# Patient Record
Sex: Female | Born: 1955
Health system: Southern US, Community
[De-identification: ages and names within clinical notes are randomized; demographics above are authoritative.]

## PROBLEM LIST (undated history)

## (undated) ENCOUNTER — Ambulatory Visit (HOSPITAL_COMMUNITY): Admission: EM | Payer: 59 | Source: Home / Self Care

## (undated) DIAGNOSIS — G709 Myoneural disorder, unspecified: Secondary | ICD-10-CM

## (undated) DIAGNOSIS — I499 Cardiac arrhythmia, unspecified: Secondary | ICD-10-CM

## (undated) DIAGNOSIS — I10 Essential (primary) hypertension: Secondary | ICD-10-CM

## (undated) DIAGNOSIS — E042 Nontoxic multinodular goiter: Secondary | ICD-10-CM

## (undated) DIAGNOSIS — E785 Hyperlipidemia, unspecified: Secondary | ICD-10-CM

## (undated) HISTORY — PX: SHOULDER ARTHROSCOPY: SHX128

## (undated) HISTORY — PX: APPENDECTOMY: SHX54

## (undated) HISTORY — PX: KNEE ARTHROSCOPY: SUR90

---

## 2005-04-24 ENCOUNTER — Ambulatory Visit: Payer: Self-pay | Admitting: Pain Medicine

## 2017-03-11 MED FILL — PROPRANOLOL HCL ER 60 MG CP: 60 | 90 days supply | Qty: 90 | Fill #0

## 2017-03-11 MED FILL — MOMETASONE FUROATE 0.1% CRM: 0.1 | 30 days supply | Qty: 45 | Fill #0

## 2017-05-19 DIAGNOSIS — M20011 Mallet finger of right finger(s): Secondary | ICD-10-CM | POA: Diagnosis not present

## 2017-05-29 DIAGNOSIS — R002 Palpitations: Secondary | ICD-10-CM | POA: Diagnosis not present

## 2017-06-05 MED FILL — FENOFIBRATE 145 MG TAB: 145 | 30 days supply | Qty: 30 | Fill #0

## 2017-06-08 MED FILL — PROPRANOLOL HCL ER 60 MG CP: 60 | 90 days supply | Qty: 90 | Fill #1

## 2017-06-16 DIAGNOSIS — M20011 Mallet finger of right finger(s): Secondary | ICD-10-CM | POA: Diagnosis not present

## 2017-06-22 MED FILL — ZOLPIDEM TARTRATE 5 MG TAB: 5 | 30 days supply | Qty: 30 | Fill #0

## 2017-07-06 MED FILL — FENOFIBRATE 145 MG TAB: 145 | 30 days supply | Qty: 30 | Fill #1

## 2017-07-21 DIAGNOSIS — M20011 Mallet finger of right finger(s): Secondary | ICD-10-CM | POA: Diagnosis not present

## 2017-08-18 DIAGNOSIS — M20011 Mallet finger of right finger(s): Secondary | ICD-10-CM | POA: Diagnosis not present

## 2017-08-21 MED FILL — FENOFIBRATE 145 MG TABLET: 145 | 30 days supply | Qty: 30 | Fill #2

## 2017-08-24 MED FILL — MOMETASONE FUROATE 0.1% CRM: 0.1 | 20 days supply | Qty: 45 | Fill #0

## 2017-08-26 MED FILL — PROPRANOLOL HCL ER 60 MG CP: 60 | 90 days supply | Qty: 90 | Fill #2

## 2017-08-28 MED FILL — EZETIMIBE 10 MG TAB: 10 | 90 days supply | Qty: 90 | Fill #0

## 2017-09-22 DIAGNOSIS — M20011 Mallet finger of right finger(s): Secondary | ICD-10-CM | POA: Diagnosis not present

## 2017-10-05 MED FILL — FENOFIBRATE 145 MG TABLET: 145 | 30 days supply | Qty: 30 | Fill #3

## 2017-10-20 DIAGNOSIS — M20011 Mallet finger of right finger(s): Secondary | ICD-10-CM | POA: Diagnosis not present

## 2017-11-16 MED FILL — FENOFIBRATE 145 MG TABLET: 145 | 30 days supply | Qty: 30 | Fill #4

## 2017-12-04 MED FILL — EZETIMIBE 10 MG TABS: 10 | 90 days supply | Qty: 90 | Fill #1

## 2017-12-10 MED FILL — FENOFIBRATE 145 MG TABLET: 145 | 90 days supply | Qty: 90 | Fill #5

## 2018-01-04 DIAGNOSIS — E782 Mixed hyperlipidemia: Secondary | ICD-10-CM | POA: Diagnosis not present

## 2018-01-04 DIAGNOSIS — I10 Essential (primary) hypertension: Secondary | ICD-10-CM | POA: Diagnosis not present

## 2018-01-04 MED FILL — PROPRANOLOL HCL ER 60 MG CP: 60 | 90 days supply | Qty: 90 | Fill #0

## 2018-02-26 MED FILL — EZETIMIBE 10 MG TABLET: 10 | 90 days supply | Qty: 90 | Fill #2

## 2018-03-29 MED FILL — FENOFIBRATE 145 MG TAB: 145 | 90 days supply | Qty: 90 | Fill #6

## 2018-03-29 MED FILL — PROPRANOLOL HCL ER 60 MG CP: 60 | 90 days supply | Qty: 90 | Fill #1

## 2018-06-25 MED FILL — EZETIMIBE 10 MG TABLET: 10 | 90 days supply | Qty: 90 | Fill #3

## 2018-06-25 MED FILL — PROPRANOLOL HCL ER 60 MG CP: 60 | 90 days supply | Qty: 90 | Fill #0

## 2018-06-25 MED FILL — FENOFIBRATE 145 MG TAB: 145 | 90 days supply | Qty: 90 | Fill #0

## 2018-06-29 DIAGNOSIS — Z1211 Encounter for screening for malignant neoplasm of colon: Secondary | ICD-10-CM | POA: Diagnosis not present

## 2018-06-29 DIAGNOSIS — I10 Essential (primary) hypertension: Secondary | ICD-10-CM | POA: Diagnosis not present

## 2018-06-29 DIAGNOSIS — Z0000 Encounter for general adult medical examination without abnormal findings: Secondary | ICD-10-CM | POA: Diagnosis not present

## 2018-06-29 DIAGNOSIS — E7800 Pure hypercholesterolemia, unspecified: Secondary | ICD-10-CM | POA: Diagnosis not present

## 2018-07-15 ENCOUNTER — Encounter: Payer: Self-pay | Admitting: *Deleted

## 2018-07-19 ENCOUNTER — Telehealth: Payer: Self-pay | Admitting: Gastroenterology

## 2018-07-19 ENCOUNTER — Other Ambulatory Visit: Payer: Self-pay

## 2018-07-19 DIAGNOSIS — Z1211 Encounter for screening for malignant neoplasm of colon: Secondary | ICD-10-CM

## 2018-07-19 NOTE — Telephone Encounter (Signed)
ERROR

## 2018-07-19 NOTE — Telephone Encounter (Signed)
Gastroenterology Pre-Procedure Review  Request Date: 08/11/18   [facility] Requesting Physician: Dr. Marius Ditch  PATIENT REVIEW QUESTIONS: The patient responded to the following health history questions as indicated:    1. Are you having any GI issues? no 2. Do you have a personal history of Polyps? yes (2010??  polyps) 3. Do you have a family history of Colon Cancer or Polyps? no 4. Diabetes Mellitus? no 5. Joint replacements in the past 12 months?no 6. Major health problems in the past 3 months?no 7. Any artificial heart valves, MVP, or defibrillator?no    MEDICATIONS & ALLERGIES:    Patient reports the following regarding taking any anticoagulation/antiplatelet therapy:   Plavix, Coumadin, Eliquis, Xarelto, Lovenox, Pradaxa, Brilinta, or Effient? no Aspirin? no  Patient confirms/reports the following medications:  No current outpatient medications on file.   No current facility-administered medications for this visit.   Hydrocodone Percocet Theraflu  Patient confirms/reports the following allergies:  Allergies not on file  No orders of the defined types were placed in this encounter.   AUTHORIZATION INFORMATION Primary Insurance: 1D#: Group #:  Secondary Insurance: 1D#: Group #:  SCHEDULE INFORMATION: Date: 08/11/2018    [place] Time: Location:  [place]

## 2018-07-28 DIAGNOSIS — I10 Essential (primary) hypertension: Secondary | ICD-10-CM | POA: Diagnosis not present

## 2018-08-05 NOTE — Discharge Instructions (Signed)
General Anesthesia, Adult, Care After °These instructions provide you with information about caring for yourself after your procedure. Your health care provider may also give you more specific instructions. Your treatment has been planned according to current medical practices, but problems sometimes occur. Call your health care provider if you have any problems or questions after your procedure. °What can I expect after the procedure? °After the procedure, it is common to have: °· Vomiting. °· A sore throat. °· Mental slowness. ° °It is common to feel: °· Nauseous. °· Cold or shivery. °· Sleepy. °· Tired. °· Sore or achy, even in parts of your body where you did not have surgery. ° °Follow these instructions at home: °For at least 24 hours after the procedure: °· Do not: °? Participate in activities where you could fall or become injured. °? Drive. °? Use heavy machinery. °? Drink alcohol. °? Take sleeping pills or medicines that cause drowsiness. °? Make important decisions or sign legal documents. °? Take care of children on your own. °· Rest. °Eating and drinking °· If you vomit, drink water, juice, or soup when you can drink without vomiting. °· Drink enough fluid to keep your urine clear or pale yellow. °· Make sure you have little or no nausea before eating solid foods. °· Follow the diet recommended by your health care provider. °General instructions °· Have a responsible adult stay with you until you are awake and alert. °· Return to your normal activities as told by your health care provider. Ask your health care provider what activities are safe for you. °· Take over-the-counter and prescription medicines only as told by your health care provider. °· If you smoke, do not smoke without supervision. °· Keep all follow-up visits as told by your health care provider. This is important. °Contact a health care provider if: °· You continue to have nausea or vomiting at home, and medicines are not helpful. °· You  cannot drink fluids or start eating again. °· You cannot urinate after 8-12 hours. °· You develop a skin rash. °· You have fever. °· You have increasing redness at the site of your procedure. °Get help right away if: °· You have difficulty breathing. °· You have chest pain. °· You have unexpected bleeding. °· You feel that you are having a life-threatening or urgent problem. °This information is not intended to replace advice given to you by your health care provider. Make sure you discuss any questions you have with your health care provider. °Document Released: 02/23/2001 Document Revised: 04/21/2016 Document Reviewed: 11/01/2015 °Elsevier Interactive Patient Education © 2018 [employer]vier Inc. ° °

## 2018-08-06 ENCOUNTER — Other Ambulatory Visit: Payer: Self-pay

## 2018-08-09 MED FILL — SUPREP BOWEL PREP KIT: 17.5-3.13-1 | 1 days supply | Qty: 354 | Fill #0

## 2018-08-11 ENCOUNTER — Ambulatory Visit
Admission: RE | Admit: 2018-08-11 | Discharge: 2018-08-11 | Disposition: A | Discharge status: Home / Self Care | Payer: 59 | Source: Ambulatory Visit | Attending: Gastroenterology | Admitting: Gastroenterology

## 2018-08-11 ENCOUNTER — Encounter
Admission: RE | Disposition: A | Discharge status: Home / Self Care | Payer: Self-pay | Source: Ambulatory Visit | Attending: Gastroenterology

## 2018-08-11 ENCOUNTER — Ambulatory Visit: Payer: 59 | Admitting: Anesthesiology

## 2018-08-11 DIAGNOSIS — I10 Essential (primary) hypertension: Secondary | ICD-10-CM | POA: Insufficient documentation

## 2018-08-11 DIAGNOSIS — Z1211 Encounter for screening for malignant neoplasm of colon: Secondary | ICD-10-CM | POA: Insufficient documentation

## 2018-08-11 DIAGNOSIS — D120 Benign neoplasm of cecum: Secondary | ICD-10-CM

## 2018-08-11 DIAGNOSIS — E785 Hyperlipidemia, unspecified: Secondary | ICD-10-CM | POA: Insufficient documentation

## 2018-08-11 DIAGNOSIS — K635 Polyp of colon: Secondary | ICD-10-CM | POA: Diagnosis not present

## 2018-08-11 DIAGNOSIS — Z79899 Other long term (current) drug therapy: Secondary | ICD-10-CM | POA: Insufficient documentation

## 2018-08-11 HISTORY — DX: Cardiac arrhythmia, unspecified: I49.9

## 2018-08-11 HISTORY — DX: Hyperlipidemia, unspecified: E78.5

## 2018-08-11 HISTORY — DX: Nontoxic multinodular goiter: E04.2

## 2018-08-11 HISTORY — DX: Myoneural disorder, unspecified: G70.9

## 2018-08-11 HISTORY — PX: COLONOSCOPY WITH PROPOFOL: SHX5780

## 2018-08-11 HISTORY — PX: POLYPECTOMY: SHX5525

## 2018-08-11 HISTORY — DX: Essential (primary) hypertension: I10

## 2018-08-11 SURGERY — COLONOSCOPY WITH PROPOFOL
Anesthesia: General | Site: Rectum

## 2018-08-11 MED ORDER — ONDANSETRON HCL 4 MG/2ML IJ SOLN: 4.0000 mg | Freq: Once | INTRAMUSCULAR | Status: DC | PRN

## 2018-08-11 MED ORDER — SODIUM CHLORIDE 0.9 % IV SOLN: INTRAVENOUS | Status: DC

## 2018-08-11 MED ORDER — PROPOFOL 10 MG/ML IV BOLUS
INTRAVENOUS | Status: DC | PRN
  Administered 2018-08-11: 60 mg via INTRAVENOUS
  Administered 2018-08-11 (×4): 20 mg via INTRAVENOUS

## 2018-08-11 MED ORDER — LACTATED RINGERS IV SOLN
10.0000 mL/h | INTRAVENOUS | Status: DC
  Administered 2018-08-11 (×2): via INTRAVENOUS
  Administered 2018-08-11: 10 mL/h via INTRAVENOUS

## 2018-08-11 MED ORDER — LIDOCAINE HCL (CARDIAC) PF 100 MG/5ML IV SOSY
PREFILLED_SYRINGE | INTRAVENOUS | Status: DC | PRN
  Administered 2018-08-11: 100 mg via INTRAVENOUS

## 2018-08-11 SURGICAL SUPPLY — 25 items
CANISTER SUCT 1200ML W/VALVE (MISCELLANEOUS) ×3 IMPLANT
CLIP HMST 235XBRD CATH ROT (MISCELLANEOUS) IMPLANT
CLIP RESOLUTION 360 11X235 (MISCELLANEOUS)
ELECT REM PT RETURN 9FT ADLT (ELECTROSURGICAL)
ELECTRODE REM PT RTRN 9FT ADLT (ELECTROSURGICAL) IMPLANT
FCP ESCP3.2XJMB 240X2.8X (MISCELLANEOUS)
FORCEPS BIOP RAD 4 LRG CAP 4 (CUTTING FORCEPS) ×3 IMPLANT
FORCEPS BIOP RJ4 240 W/NDL (MISCELLANEOUS)
FORCEPS ESCP3.2XJMB 240X2.8X (MISCELLANEOUS) IMPLANT
GOWN CVR UNV OPN BCK APRN NK (MISCELLANEOUS) ×2 IMPLANT
GOWN ISOL THUMB LOOP REG UNIV (MISCELLANEOUS) ×4
INJECTOR VARIJECT VIN23 (MISCELLANEOUS) IMPLANT
KIT DEFENDO VALVE AND CONN (KITS) IMPLANT
KIT ENDO PROCEDURE OLY (KITS) ×3 IMPLANT
MARKER SPOT ENDO TATTOO 5ML (MISCELLANEOUS) IMPLANT
PROBE APC STR FIRE (PROBE) IMPLANT
RETRIEVER NET ROTH 2.5X230 LF (MISCELLANEOUS) IMPLANT
SNARE COLD EXACTO (MISCELLANEOUS) IMPLANT
SNARE SHORT THROW 13M SML OVAL (MISCELLANEOUS) IMPLANT
SNARE SHORT THROW 30M LRG OVAL (MISCELLANEOUS) IMPLANT
SNARE SNG USE RND 15MM (INSTRUMENTS) IMPLANT
SPOT EX ENDOSCOPIC TATTOO (MISCELLANEOUS)
TRAP ETRAP POLY (MISCELLANEOUS) IMPLANT
VARIJECT INJECTOR VIN23 (MISCELLANEOUS)
WATER STERILE IRR 250ML POUR (IV SOLUTION) ×3 IMPLANT

## 2018-08-11 NOTE — Op Note (Signed)
[facility] Gastroenterology Patient Name: Nicole Marsh Procedure Date: 08/11/2018 7:50 AM MRN: [id number] Account #: [id number] Date of Birth: 1956/02/28 Admit Type: Outpatient Age: 62 Room: [facility] OR ROOM 01 Gender: Female Note Status: Finalized Procedure:            Colonoscopy Indications:          Screening for colorectal malignant neoplasm, Last                        colonoscopy 10 years ago Providers:            Lin Landsman MD, MD Referring MD:         Jaynie Bream PA, PA (Referring MD) Medicines:            Monitored Anesthesia Care Complications:        No immediate complications. Estimated blood loss: None. Procedure:            Pre-Anesthesia Assessment:                       - Prior to the procedure, a History and Physical was                        performed, and patient medications and allergies were                        reviewed. The patient is competent. The risks and                        benefits of the procedure and the sedation options and                        risks were discussed with the patient. All questions                        were answered and informed consent was obtained.                        Patient identification and proposed procedure were                        verified by the physician, the nurse, the                        anesthesiologist, the anesthetist and the technician in                        the pre-procedure area in the procedure room in the                        endoscopy suite. Mental Status Examination: alert and                        oriented. Airway Examination: normal oropharyngeal                        airway and neck mobility. Respiratory Examination:                        clear to auscultation. CV Examination: normal.  Prophylactic Antibiotics: The patient does not require                        prophylactic antibiotics. Prior Anticoagulants: The                         patient has taken no previous anticoagulant or                        antiplatelet agents. ASA Grade Assessment: II - A                        patient with mild systemic disease. After reviewing the                        risks and benefits, the patient was deemed in                        satisfactory condition to undergo the procedure. The                        anesthesia plan was to use monitored anesthesia care                        (MAC). Immediately prior to administration of                        medications, the patient was re-assessed for adequacy                        to receive sedatives. The heart rate, respiratory rate,                        oxygen saturations, blood pressure, adequacy of                        pulmonary ventilation, and response to care were                        monitored throughout the procedure. The physical status                        of the patient was re-assessed after the procedure.                       After obtaining informed consent, the colonoscope was                        passed under direct vision. Throughout the procedure,                        the patient's blood pressure, pulse, and oxygen                        saturations were monitored continuously. The was                        introduced through the anus and advanced to the the  cecum, identified by appendiceal orifice and ileocecal                        valve. The colonoscopy was performed without                        difficulty. The patient tolerated the procedure well.                        The quality of the bowel preparation was evaluated                        using the BBPS [facility] Bowel Preparation Scale) with                        scores of: Right Colon = 3, Transverse Colon = 3 and                        Left Colon = 3 (entire mucosa seen well with no                        residual staining, small fragments of stool or opaque                         liquid). The total BBPS score equals 9. Findings:      The perianal and digital rectal examinations were normal. Pertinent       negatives include normal sphincter tone and no palpable rectal lesions.      The terminal ileum appeared normal.      A diminutive polyp was found in the cecum. The polyp was sessile. The       polyp was removed with a cold biopsy forceps. Resection and retrieval       were complete.      The exam was otherwise without abnormality.      The retroflexed view of the distal rectum and anal verge was normal and       showed no anal or rectal abnormalities. Impression:           - The examined portion of the ileum was normal.                       - One diminutive polyp in the cecum, removed with a                        cold biopsy forceps. Resected and retrieved.                       - The examination was otherwise normal.                       - The distal rectum and anal verge are normal on                        retroflexion view. Recommendation:       - Discharge patient to home (with escort).                       - Resume regular diet today.                       -  Continue present medications.                       - Await pathology results.                       - Repeat colonoscopy in 10 years for surveillance based                        on pathology results. Procedure Code(s):    --- Professional ---                       [phone], Colonoscopy, flexible; with biopsy, single or                        multiple Diagnosis Code(s):    --- Professional ---                       Z12.11, Encounter for screening for malignant neoplasm                        of colon                       D12.0, Benign neoplasm of cecum CPT copyright 2017 American Medical Association. All rights reserved. The codes documented in this report are preliminary and upon coder review may  be revised to meet current compliance requirements. Dr. Ulyess Mort Lin Landsman MD, MD 08/11/2018 8:39:28 AM This report has been signed electronically. Number of Addenda: 0 Note Initiated On: 08/11/2018 7:50 AM Scope Withdrawal Time: 0 hours 7 minutes 38 seconds  Total Procedure Duration: 0 hours 10 minutes 56 seconds       [facility]

## 2018-08-11 NOTE — Anesthesia Preprocedure Evaluation (Signed)
Anesthesia Evaluation  Patient identified by MRN, date of birth, ID band Patient awake    Reviewed: Allergy & Precautions, NPO status   Airway Mallampati: II  TM Distance: >3 FB     Dental   Pulmonary neg pulmonary ROS,    breath sounds clear to auscultation       Cardiovascular hypertension,  Rhythm:Regular Rate:Normal  Normal stress test 2017  HLD   Neuro/Psych    GI/Hepatic negative GI ROS, Neg liver ROS,   Endo/Other  negative endocrine ROS  Renal/GU      Musculoskeletal   Abdominal   Peds  Hematology   Anesthesia Other Findings   Reproductive/Obstetrics                             Anesthesia Physical Anesthesia Plan  ASA: II  Anesthesia Plan: General   Post-op Pain Management:    Induction:   PONV Risk Score and Plan:   Airway Management Planned: Natural Airway  Additional Equipment:   Intra-op Plan:   Post-operative Plan:   Informed Consent: I have reviewed the patients History and Physical, chart, labs and discussed the procedure including the risks, benefits and alternatives for the proposed anesthesia with the patient or authorized representative who has indicated his/her understanding and acceptance.   Dental advisory given  Plan Discussed with: CRNA  Anesthesia Plan Comments:         Anesthesia Quick Evaluation

## 2018-08-11 NOTE — Anesthesia Postprocedure Evaluation (Signed)
Anesthesia Post Note  Patient: Nicole Marsh  Procedure(s) Performed: COLONOSCOPY WITH PROPOFOL WITH BIOPSY (N/A Rectum) POLYPECTOMY (N/A Rectum)  Patient location during evaluation: PACU Anesthesia Type: General Level of consciousness: awake Pain management: pain level controlled Vital Signs Assessment: post-procedure vital signs reviewed and stable Respiratory status: respiratory function stable Cardiovascular status: stable Postop Assessment: no signs of nausea or vomiting Anesthetic complications: no    Veda Canning

## 2018-08-11 NOTE — Transfer of Care (Signed)
Immediate Anesthesia Transfer of Care Note  Patient: Nicole Marsh  Procedure(s) Performed: COLONOSCOPY WITH PROPOFOL WITH BIOPSY (N/A Rectum) POLYPECTOMY (N/A Rectum)  Patient Location: PACU  Anesthesia Type: General  Level of Consciousness: awake, alert  and patient cooperative  Airway and Oxygen Therapy: Patient Spontanous Breathing and Patient connected to supplemental oxygen  Post-op Assessment: Post-op Vital signs reviewed, Patient's Cardiovascular Status Stable, Respiratory Function Stable, Patent Airway and No signs of Nausea or vomiting  Post-op Vital Signs: Reviewed and stable  Complications: No apparent anesthesia complications
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
## 2018-08-11 NOTE — Anesthesia Procedure Notes (Signed)
Procedure Name: MAC Date/Time: 08/11/2018 8:21 AM Performed by: Lind Guest, CRNA Pre-anesthesia Checklist: Patient identified, Emergency Drugs available, Suction available, Patient being monitored and Timeout performed Patient Re-evaluated:Patient Re-evaluated prior to induction Oxygen Delivery Method: Nasal cannula

## 2018-08-12 ENCOUNTER — Encounter: Payer: Self-pay | Admitting: Gastroenterology

## 2018-08-13 LAB — SURGICAL PATHOLOGY

## 2018-08-16 ENCOUNTER — Encounter: Payer: Self-pay | Admitting: Gastroenterology

## 2018-10-12 MED FILL — PROPRANOLOL HCL ER 60 MG CP: 60 | 90 days supply | Qty: 90 | Fill #1

## 2018-10-12 MED FILL — FENOFIBRATE 145 MG TAB: 145 | 90 days supply | Qty: 90 | Fill #1

## 2018-10-12 MED FILL — EZETIMIBE 10 MG TABS: 10 | 90 days supply | Qty: 90 | Fill #0

## 2018-12-29 DIAGNOSIS — I10 Essential (primary) hypertension: Secondary | ICD-10-CM | POA: Diagnosis not present

## 2018-12-29 DIAGNOSIS — E7800 Pure hypercholesterolemia, unspecified: Secondary | ICD-10-CM | POA: Diagnosis not present

## 2018-12-29 DIAGNOSIS — R7989 Other specified abnormal findings of blood chemistry: Secondary | ICD-10-CM | POA: Diagnosis not present

## 2018-12-29 DIAGNOSIS — M25512 Pain in left shoulder: Secondary | ICD-10-CM | POA: Diagnosis not present

## 2018-12-29 MED FILL — TRIAMCINOLONE 0.1% OINTMEN: 0.1 | 15 days supply | Qty: 30 | Fill #0

## 2019-01-03 MED FILL — FENOFIBRATE 145 MG TABLET: 145 | 90 days supply | Qty: 90 | Fill #2

## 2019-01-03 MED FILL — PROPRANOLOL HCL ER 60 MG CP: 60 | 90 days supply | Qty: 90 | Fill #2

## 2019-01-03 MED FILL — EZETIMIBE 10 MG TABS: 10 | 90 days supply | Qty: 90 | Fill #1

## 2019-05-03 MED FILL — FENOFIBRATE 145 MG TABLET: 145 | 30 days supply | Qty: 30 | Fill #0

## 2019-05-03 MED FILL — EZETIMIBE 10 MG TABS: 10 | 30 days supply | Qty: 30 | Fill #0

## 2019-05-03 MED FILL — PROPRANOLOL HCL ER 60 MG CP: 60 | 30 days supply | Qty: 30 | Fill #0

## 2019-05-04 DIAGNOSIS — I10 Essential (primary) hypertension: Secondary | ICD-10-CM | POA: Diagnosis not present

## 2019-05-04 DIAGNOSIS — E782 Mixed hyperlipidemia: Secondary | ICD-10-CM | POA: Diagnosis not present

## 2019-05-04 DIAGNOSIS — I471 Supraventricular tachycardia: Secondary | ICD-10-CM | POA: Diagnosis not present

## 2019-05-05 DIAGNOSIS — I479 Paroxysmal tachycardia, unspecified: Secondary | ICD-10-CM | POA: Diagnosis not present

## 2019-05-26 DIAGNOSIS — I471 Supraventricular tachycardia: Secondary | ICD-10-CM | POA: Diagnosis not present

## 2019-06-07 MED FILL — FENOFIBRATE 145 MG TABS: 145 | 90 days supply | Qty: 90 | Fill #0

## 2019-06-07 MED FILL — EZETIMIBE 10 MG TABS: 10 | 90 days supply | Qty: 90 | Fill #0

## 2019-06-07 MED FILL — PROPRANOLOL HCL ER 60 MG CP: 60 | 90 days supply | Qty: 90 | Fill #0

## 2019-06-22 DIAGNOSIS — M79672 Pain in left foot: Secondary | ICD-10-CM | POA: Diagnosis not present

## 2019-06-22 MED FILL — GABAPENTIN 100 MG CAPSULE: 100 | 30 days supply | Qty: 90 | Fill #0

## 2019-07-13 DIAGNOSIS — Z1231 Encounter for screening mammogram for malignant neoplasm of breast: Secondary | ICD-10-CM | POA: Diagnosis not present

## 2019-07-13 DIAGNOSIS — Z0000 Encounter for general adult medical examination without abnormal findings: Secondary | ICD-10-CM | POA: Diagnosis not present

## 2019-07-13 DIAGNOSIS — R7989 Other specified abnormal findings of blood chemistry: Secondary | ICD-10-CM | POA: Diagnosis not present

## 2019-07-13 DIAGNOSIS — Z23 Encounter for immunization: Secondary | ICD-10-CM | POA: Diagnosis not present

## 2019-07-13 DIAGNOSIS — E7800 Pure hypercholesterolemia, unspecified: Secondary | ICD-10-CM | POA: Diagnosis not present

## 2019-07-13 MED FILL — TRIAMCINOLONE 0.1% OINTMENT: 0.1 | 30 days supply | Qty: 80 | Fill #0

## 2019-09-15 DIAGNOSIS — Z1231 Encounter for screening mammogram for malignant neoplasm of breast: Secondary | ICD-10-CM | POA: Diagnosis not present

## 2019-09-20 MED FILL — EZETIMIBE 10 MG TABS: 10 | 90 days supply | Qty: 90 | Fill #1

## 2019-09-20 MED FILL — PROPRANOLOL HCL ER 60 MG CP: 60 | 90 days supply | Qty: 90 | Fill #1

## 2019-09-20 MED FILL — FENOFIBRATE 145 MG TABS: 145 | 90 days supply | Qty: 90 | Fill #1

## 2019-12-27 MED FILL — EZETIMIBE 10 MG TABS: 10 | 90 days supply | Qty: 90 | Fill #2

## 2019-12-27 MED FILL — PROPRANOLOL HCL ER 60 MG CP: 60 | 90 days supply | Qty: 90 | Fill #2

## 2019-12-27 MED FILL — FENOFIBRATE 145 MG TABS: 145 | 90 days supply | Qty: 90 | Fill #2

## 2020-01-13 DIAGNOSIS — Z23 Encounter for immunization: Secondary | ICD-10-CM | POA: Diagnosis not present

## 2020-01-13 DIAGNOSIS — R0601 Orthopnea: Secondary | ICD-10-CM | POA: Diagnosis not present

## 2020-01-13 DIAGNOSIS — I10 Essential (primary) hypertension: Secondary | ICD-10-CM | POA: Diagnosis not present

## 2020-01-13 DIAGNOSIS — E782 Mixed hyperlipidemia: Secondary | ICD-10-CM | POA: Diagnosis not present

## 2020-01-16 DIAGNOSIS — R0601 Orthopnea: Secondary | ICD-10-CM | POA: Diagnosis not present

## 2020-01-19 MED FILL — TRIAMCINOLONE 0.1% OINTMENT: 0.1 | 30 days supply | Qty: 80 | Fill #1

## 2020-02-06 DIAGNOSIS — I493 Ventricular premature depolarization: Secondary | ICD-10-CM | POA: Diagnosis not present

## 2020-02-06 DIAGNOSIS — I10 Essential (primary) hypertension: Secondary | ICD-10-CM | POA: Diagnosis not present

## 2020-02-06 DIAGNOSIS — R079 Chest pain, unspecified: Secondary | ICD-10-CM | POA: Diagnosis not present

## 2020-02-06 DIAGNOSIS — I471 Supraventricular tachycardia: Secondary | ICD-10-CM | POA: Diagnosis not present

## 2020-02-29 DIAGNOSIS — R079 Chest pain, unspecified: Secondary | ICD-10-CM | POA: Diagnosis not present

## 2020-02-29 DIAGNOSIS — R0602 Shortness of breath: Secondary | ICD-10-CM | POA: Diagnosis not present

## 2020-03-07 DIAGNOSIS — I471 Supraventricular tachycardia: Secondary | ICD-10-CM | POA: Diagnosis not present

## 2020-03-07 DIAGNOSIS — R0600 Dyspnea, unspecified: Secondary | ICD-10-CM | POA: Diagnosis not present

## 2020-03-07 DIAGNOSIS — I10 Essential (primary) hypertension: Secondary | ICD-10-CM | POA: Diagnosis not present

## 2020-03-07 DIAGNOSIS — E782 Mixed hyperlipidemia: Secondary | ICD-10-CM | POA: Diagnosis not present

## 2020-04-05 MED FILL — CEPHALEXIN 500 MG CAPSULE: 500 | 7 days supply | Qty: 28 | Fill #0

## 2020-04-20 MED FILL — FENOFIBRATE 145 MG TABS: 145 | 90 days supply | Qty: 90 | Fill #3

## 2020-04-20 MED FILL — PROPRANOLOL HCL ER 60 MG CP: 60 | 90 days supply | Qty: 90 | Fill #3

## 2020-04-20 MED FILL — EZETIMIBE 10 MG TABS: 10 | 90 days supply | Qty: 90 | Fill #3

## 2020-08-13 MED FILL — EZETIMIBE 10 MG TABS: 10 | 90 days supply | Qty: 90 | Fill #0

## 2020-08-13 MED FILL — PROPRANOLOL HCL ER 60 MG CP: 60 | 90 days supply | Qty: 90 | Fill #0

## 2020-08-13 MED FILL — FENOFIBRATE 145 MG TABS: 145 | 90 days supply | Qty: 90 | Fill #0

## 2020-09-03 ENCOUNTER — Other Ambulatory Visit (HOSPITAL_COMMUNITY): Payer: Self-pay | Admitting: Family Medicine

## 2020-09-03 ENCOUNTER — Other Ambulatory Visit: Payer: Self-pay

## 2020-09-03 ENCOUNTER — Other Ambulatory Visit: Payer: Self-pay | Admitting: Family Medicine

## 2020-09-03 ENCOUNTER — Ambulatory Visit: Payer: Self-pay

## 2020-09-03 DIAGNOSIS — M542 Cervicalgia: Secondary | ICD-10-CM

## 2020-09-03 MED FILL — METHOCARBAMOL 500 MG TABS: 500 | 10 days supply | Qty: 30 | Fill #0

## 2020-09-06 ENCOUNTER — Other Ambulatory Visit (HOSPITAL_COMMUNITY): Payer: Self-pay | Admitting: Family Medicine

## 2020-09-06 MED FILL — LIDOCAINE PATCH 5%: 5 | 30 days supply | Qty: 30 | Fill #0

## 2020-09-12 MED FILL — METHOCARBAMOL 500 MG TABS: 500 | 10 days supply | Qty: 30 | Fill #1

## 2020-09-27 ENCOUNTER — Other Ambulatory Visit (HOSPITAL_COMMUNITY): Payer: Self-pay | Admitting: Family Medicine

## 2020-09-27 MED FILL — METHOCARBAMOL 500 MG TABS: 500 | 10 days supply | Qty: 30 | Fill #0

## 2020-10-01 ENCOUNTER — Other Ambulatory Visit (HOSPITAL_COMMUNITY): Payer: Self-pay | Admitting: Family Medicine

## 2020-10-03 MED FILL — LIDOCAINE PATCH 5%: 5 | 30 days supply | Qty: 30 | Fill #0

## 2020-10-08 ENCOUNTER — Other Ambulatory Visit (HOSPITAL_COMMUNITY): Payer: Self-pay | Admitting: Orthopedic Surgery

## 2020-10-08 MED FILL — METHYLPREDNISOLONE 4 MG TBP: 4 | 6 days supply | Qty: 21 | Fill #0

## 2020-11-05 ENCOUNTER — Other Ambulatory Visit (HOSPITAL_COMMUNITY): Payer: Self-pay | Admitting: Orthopedic Surgery

## 2020-11-05 MED FILL — LIDOCAINE PATCH 5%: 5 | 30 days supply | Qty: 30 | Fill #0

## 2020-11-27 MED FILL — EZETIMIBE 10 MG TABS: 10 | 90 days supply | Qty: 90 | Fill #1

## 2020-11-27 MED FILL — PROPRANOLOL HCL ER 60 MG CP: 60 | 90 days supply | Qty: 90 | Fill #1

## 2020-11-27 MED FILL — FENOFIBRATE 145 MG TABS: 145 | 90 days supply | Qty: 90 | Fill #1

## 2020-11-29 MED FILL — TRIAMCINOLONE 0.1% OINTMENT: 0.1 | 14 days supply | Qty: 80 | Fill #0

## 2020-12-03 MED FILL — LIDOCAINE PATCH 5%: 5 | 30 days supply | Qty: 30 | Fill #1

## 2020-12-07 ENCOUNTER — Other Ambulatory Visit (HOSPITAL_COMMUNITY): Payer: Self-pay | Admitting: Orthopedic Surgery

## 2020-12-07 MED FILL — GABAPENTIN 300 MG CAPSULE: 300 | 30 days supply | Qty: 30 | Fill #0

## 2020-12-07 MED FILL — CELECOXIB 200 MG CAP: 200 | 30 days supply | Qty: 60 | Fill #0

## 2021-01-17 ENCOUNTER — Other Ambulatory Visit (HOSPITAL_COMMUNITY): Payer: Self-pay | Admitting: Orthopedic Surgery

## 2021-01-17 MED FILL — GABAPENTIN 300 MG CAPSULE: 300 | 30 days supply | Qty: 30 | Fill #0

## 2021-01-17 MED FILL — CELECOXIB 200 MG CAP: 200 | 30 days supply | Qty: 60 | Fill #0

## 2021-01-21 MED FILL — LIDOCAINE PATCH 5%: 5 | 30 days supply | Qty: 30 | Fill #2

## 2021-02-05 ENCOUNTER — Other Ambulatory Visit (HOSPITAL_COMMUNITY): Payer: Self-pay | Admitting: Orthopedic Surgery

## 2021-02-11 ENCOUNTER — Other Ambulatory Visit (HOSPITAL_COMMUNITY): Payer: Self-pay | Admitting: Orthopedic Surgery

## 2021-02-18 MED FILL — LIDOCAINE PATCH 5%: 5 | 30 days supply | Qty: 30 | Fill #0

## 2021-02-18 MED FILL — GABAPENTIN 300 MG CAPSULE: 300 | 30 days supply | Qty: 30 | Fill #1

## 2021-02-18 MED FILL — CELECOXIB 200 MG CAP: 200 | 30 days supply | Qty: 30 | Fill #0

## 2021-02-25 ENCOUNTER — Other Ambulatory Visit (HOSPITAL_COMMUNITY): Payer: Self-pay | Admitting: Family Medicine

## 2021-02-25 MED FILL — EZETIMIBE 10 MG TABS: 10 | 90 days supply | Qty: 90 | Fill #0

## 2021-02-25 MED FILL — PROPRANOLOL HCL ER 60 MG CP: 60 | 90 days supply | Qty: 90 | Fill #0

## 2021-02-25 MED FILL — FENOFIBRATE 145 MG TABS: 145 | 90 days supply | Qty: 90 | Fill #0

## 2021-03-15 ENCOUNTER — Other Ambulatory Visit (HOSPITAL_COMMUNITY): Payer: Self-pay

## 2021-03-15 ENCOUNTER — Other Ambulatory Visit (HOSPITAL_COMMUNITY): Payer: Self-pay | Admitting: Orthopedic Surgery

## 2021-03-15 MED FILL — Celecoxib Cap 200 MG: ORAL | 30 days supply | Qty: 30 | Fill #0 | Status: AC

## 2021-03-15 MED FILL — Gabapentin Cap 300 MG: ORAL | 30 days supply | Qty: 30 | Fill #0 | Status: CN

## 2021-03-15 MED FILL — Lidocaine Patch 5%: CUTANEOUS | 30 days supply | Qty: 30 | Fill #0 | Status: AC

## 2021-03-18 ENCOUNTER — Other Ambulatory Visit (HOSPITAL_COMMUNITY): Payer: Self-pay

## 2021-03-18 MED FILL — Gabapentin Cap 300 MG: ORAL | 30 days supply | Qty: 30 | Fill #0 | Status: AC

## 2021-04-14 ENCOUNTER — Other Ambulatory Visit (HOSPITAL_COMMUNITY): Payer: Self-pay | Admitting: Orthopedic Surgery

## 2021-04-14 MED FILL — Celecoxib Cap 200 MG: ORAL | 30 days supply | Qty: 60 | Fill #0 | Status: AC

## 2021-04-14 MED FILL — Lidocaine Patch 5%: CUTANEOUS | 30 days supply | Qty: 30 | Fill #1 | Status: AC

## 2021-04-15 ENCOUNTER — Other Ambulatory Visit (HOSPITAL_COMMUNITY): Payer: Self-pay

## 2021-04-15 MED FILL — Gabapentin Cap 300 MG: ORAL | 30 days supply | Qty: 30 | Fill #1 | Status: AC

## 2021-04-16 ENCOUNTER — Other Ambulatory Visit (HOSPITAL_COMMUNITY): Payer: Self-pay

## 2021-04-16 MED ORDER — CELECOXIB 200 MG PO CAPS
200.0000 mg | ORAL_CAPSULE | Freq: Every day | ORAL | 1 refills | Status: AC
  Filled 2021-04-16: qty 30, 30d supply, fill #0

## 2021-04-17 ENCOUNTER — Other Ambulatory Visit (HOSPITAL_COMMUNITY): Payer: Self-pay

## 2021-04-26 DIAGNOSIS — E079 Disorder of thyroid, unspecified: Secondary | ICD-10-CM | POA: Diagnosis not present

## 2021-04-26 DIAGNOSIS — I10 Essential (primary) hypertension: Secondary | ICD-10-CM | POA: Diagnosis not present

## 2021-04-26 DIAGNOSIS — Z0000 Encounter for general adult medical examination without abnormal findings: Secondary | ICD-10-CM | POA: Diagnosis not present

## 2021-04-26 DIAGNOSIS — Z1231 Encounter for screening mammogram for malignant neoplasm of breast: Secondary | ICD-10-CM | POA: Diagnosis not present

## 2021-04-26 DIAGNOSIS — E782 Mixed hyperlipidemia: Secondary | ICD-10-CM | POA: Diagnosis not present

## 2021-04-26 DIAGNOSIS — R4789 Other speech disturbances: Secondary | ICD-10-CM | POA: Diagnosis not present

## 2021-04-26 DIAGNOSIS — Z23 Encounter for immunization: Secondary | ICD-10-CM | POA: Diagnosis not present

## 2021-05-03 ENCOUNTER — Other Ambulatory Visit (HOSPITAL_COMMUNITY): Payer: Self-pay

## 2021-05-03 MED ORDER — GABAPENTIN 300 MG PO CAPS
300.0000 mg | ORAL_CAPSULE | Freq: Every day | ORAL | 0 refills | Status: AC
  Filled 2021-05-03 – 2021-07-01 (×3): qty 30, 30d supply, fill #0

## 2021-05-13 ENCOUNTER — Other Ambulatory Visit (HOSPITAL_COMMUNITY): Payer: Self-pay

## 2021-05-13 MED ORDER — LIDOCAINE 5 % EX PTCH
1.0000 | MEDICATED_PATCH | Freq: Every day | CUTANEOUS | 2 refills | Status: AC
  Filled 2021-05-13 – 2021-06-04 (×2): qty 30, 30d supply, fill #0
  Filled 2021-09-09: qty 30, 30d supply, fill #1

## 2021-05-13 MED FILL — Celecoxib Cap 200 MG: ORAL | 30 days supply | Qty: 60 | Fill #1 | Status: AC

## 2021-05-13 MED FILL — Gabapentin Cap 300 MG: ORAL | 30 days supply | Qty: 30 | Fill #2 | Status: AC

## 2021-05-22 ENCOUNTER — Other Ambulatory Visit (HOSPITAL_COMMUNITY): Payer: Self-pay

## 2021-05-31 DIAGNOSIS — R413 Other amnesia: Secondary | ICD-10-CM | POA: Diagnosis not present

## 2021-05-31 DIAGNOSIS — R4789 Other speech disturbances: Secondary | ICD-10-CM | POA: Diagnosis not present

## 2021-06-04 ENCOUNTER — Other Ambulatory Visit (HOSPITAL_COMMUNITY): Payer: Self-pay

## 2021-06-04 MED FILL — Celecoxib Cap 200 MG: ORAL | 30 days supply | Qty: 60 | Fill #2 | Status: CN

## 2021-06-05 ENCOUNTER — Other Ambulatory Visit (HOSPITAL_COMMUNITY): Payer: Self-pay

## 2021-06-10 ENCOUNTER — Other Ambulatory Visit (HOSPITAL_COMMUNITY): Payer: Self-pay

## 2021-06-11 ENCOUNTER — Other Ambulatory Visit (HOSPITAL_COMMUNITY): Payer: Self-pay

## 2021-06-12 ENCOUNTER — Other Ambulatory Visit (HOSPITAL_COMMUNITY): Payer: Self-pay

## 2021-06-13 ENCOUNTER — Other Ambulatory Visit (HOSPITAL_COMMUNITY): Payer: Self-pay

## 2021-06-13 MED ORDER — FENOFIBRATE 145 MG PO TABS
145.0000 mg | ORAL_TABLET | Freq: Every day | ORAL | 3 refills | Status: DC
  Filled 2021-06-13: qty 90, 90d supply, fill #0
  Filled 2021-09-08: qty 90, 90d supply, fill #1
  Filled 2021-11-22: qty 90, 90d supply, fill #2
  Filled 2022-02-17: qty 90, 90d supply, fill #3

## 2021-06-13 MED ORDER — EZETIMIBE 10 MG PO TABS
10.0000 mg | ORAL_TABLET | Freq: Every morning | ORAL | 3 refills | Status: DC
  Filled 2021-06-13: qty 90, 90d supply, fill #0
  Filled 2021-09-08: qty 90, 90d supply, fill #1
  Filled 2021-11-22: qty 90, 90d supply, fill #2
  Filled 2022-02-17: qty 90, 90d supply, fill #3

## 2021-06-13 MED ORDER — PROPRANOLOL HCL ER 60 MG PO CP24
60.0000 mg | ORAL_CAPSULE | Freq: Every day | ORAL | 3 refills | Status: DC
  Filled 2021-06-13: qty 90, 90d supply, fill #0
  Filled 2021-09-08: qty 90, 90d supply, fill #1
  Filled 2021-11-22: qty 90, 90d supply, fill #2
  Filled 2022-02-17: qty 90, 90d supply, fill #3

## 2021-06-14 ENCOUNTER — Other Ambulatory Visit (HOSPITAL_COMMUNITY): Payer: Self-pay

## 2021-06-14 MED FILL — Celecoxib Cap 200 MG: ORAL | 30 days supply | Qty: 60 | Fill #2 | Status: AC

## 2021-06-17 ENCOUNTER — Other Ambulatory Visit (HOSPITAL_COMMUNITY): Payer: Self-pay

## 2021-06-24 ENCOUNTER — Other Ambulatory Visit (HOSPITAL_COMMUNITY): Payer: Self-pay

## 2021-06-25 ENCOUNTER — Other Ambulatory Visit (HOSPITAL_COMMUNITY): Payer: Self-pay

## 2021-07-01 ENCOUNTER — Other Ambulatory Visit (HOSPITAL_COMMUNITY): Payer: Self-pay

## 2021-07-02 ENCOUNTER — Other Ambulatory Visit: Payer: Self-pay

## 2021-07-02 ENCOUNTER — Ambulatory Visit (INDEPENDENT_AMBULATORY_CARE_PROVIDER_SITE_OTHER): Payer: PRIVATE HEALTH INSURANCE | Admitting: Orthopaedic Surgery

## 2021-07-02 ENCOUNTER — Encounter: Payer: Self-pay | Admitting: Orthopaedic Surgery

## 2021-07-02 DIAGNOSIS — M4802 Spinal stenosis, cervical region: Secondary | ICD-10-CM
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
## 2021-07-03 DIAGNOSIS — E042 Nontoxic multinodular goiter: Secondary | ICD-10-CM | POA: Diagnosis not present

## 2021-07-03 DIAGNOSIS — E079 Disorder of thyroid, unspecified: Secondary | ICD-10-CM | POA: Diagnosis not present

## 2021-07-08 DIAGNOSIS — Z1231 Encounter for screening mammogram for malignant neoplasm of breast: Secondary | ICD-10-CM | POA: Diagnosis not present

## 2021-07-09 ENCOUNTER — Encounter: Payer: Self-pay | Admitting: Orthopaedic Surgery

## 2021-07-09 ENCOUNTER — Other Ambulatory Visit: Payer: Self-pay

## 2021-07-09 ENCOUNTER — Ambulatory Visit (INDEPENDENT_AMBULATORY_CARE_PROVIDER_SITE_OTHER): Payer: PRIVATE HEALTH INSURANCE | Admitting: Orthopaedic Surgery

## 2021-07-09 VITALS — BP 125/82 | HR 74 | Ht 62.0 in | Wt 120.0 lb

## 2021-07-09 DIAGNOSIS — M4802 Spinal stenosis, cervical region: Secondary | ICD-10-CM | POA: Diagnosis not present
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
## 2021-07-17 ENCOUNTER — Telehealth: Payer: Self-pay | Admitting: Orthopaedic Surgery

## 2021-07-17 NOTE — Telephone Encounter (Signed)
Please see below. Patient wishes to speak with you.

## 2021-07-17 NOTE — Telephone Encounter (Signed)
Pt called requesting a call back from Dr. Lorin Mercy. Pt states she has question about last visit with Dr. Lorin Mercy. Pt did not go into detail only asking for Dr. Lorin Mercy to return her call. Pt phone number is [phone].

## 2021-07-29 DIAGNOSIS — E079 Disorder of thyroid, unspecified: Secondary | ICD-10-CM | POA: Diagnosis not present

## 2021-07-29 DIAGNOSIS — E782 Mixed hyperlipidemia: Secondary | ICD-10-CM | POA: Diagnosis not present

## 2021-08-28 DIAGNOSIS — H524 Presbyopia: Secondary | ICD-10-CM | POA: Diagnosis not present

## 2021-09-06 ENCOUNTER — Other Ambulatory Visit (HOSPITAL_COMMUNITY): Payer: Self-pay

## 2021-09-06 DIAGNOSIS — R197 Diarrhea, unspecified: Secondary | ICD-10-CM | POA: Diagnosis not present

## 2021-09-06 DIAGNOSIS — R109 Unspecified abdominal pain: Secondary | ICD-10-CM | POA: Diagnosis not present

## 2021-09-06 MED ORDER — FAMOTIDINE 40 MG PO TABS
40.0000 mg | ORAL_TABLET | Freq: Every day | ORAL | 0 refills | Status: AC
  Filled 2021-09-06: qty 30, 30d supply, fill #0

## 2021-09-09 ENCOUNTER — Other Ambulatory Visit (HOSPITAL_COMMUNITY): Payer: Self-pay

## 2021-09-09 DIAGNOSIS — R748 Abnormal levels of other serum enzymes: Secondary | ICD-10-CM | POA: Diagnosis not present

## 2021-09-09 DIAGNOSIS — R197 Diarrhea, unspecified: Secondary | ICD-10-CM | POA: Diagnosis not present

## 2021-09-09 DIAGNOSIS — R1013 Epigastric pain: Secondary | ICD-10-CM | POA: Diagnosis not present

## 2021-09-09 MED ORDER — PANTOPRAZOLE SODIUM 20 MG PO TBEC
20.0000 mg | DELAYED_RELEASE_TABLET | Freq: Two times a day (BID) | ORAL | 3 refills | Status: AC
  Filled 2021-09-09: qty 60, 30d supply, fill #0

## 2021-09-10 DIAGNOSIS — R748 Abnormal levels of other serum enzymes: Secondary | ICD-10-CM | POA: Diagnosis not present

## 2021-09-10 DIAGNOSIS — R1011 Right upper quadrant pain: Secondary | ICD-10-CM | POA: Diagnosis not present

## 2021-09-11 ENCOUNTER — Other Ambulatory Visit (HOSPITAL_COMMUNITY): Payer: Self-pay

## 2021-09-11 MED ORDER — AMOXICILLIN 500 MG PO CAPS
1000.0000 mg | ORAL_CAPSULE | Freq: Two times a day (BID) | ORAL | 0 refills | Status: AC
  Filled 2021-09-11: qty 56, 14d supply, fill #0

## 2021-09-11 MED ORDER — CLARITHROMYCIN 500 MG PO TABS
500.0000 mg | ORAL_TABLET | Freq: Two times a day (BID) | ORAL | 0 refills | Status: AC
  Filled 2021-09-11: qty 28, 14d supply, fill #0

## 2021-09-11 MED ORDER — PANTOPRAZOLE SODIUM 40 MG PO TBEC
40.0000 mg | DELAYED_RELEASE_TABLET | Freq: Two times a day (BID) | ORAL | 0 refills | Status: AC
  Filled 2021-09-11: qty 60, 30d supply, fill #0

## 2021-09-12 ENCOUNTER — Other Ambulatory Visit (HOSPITAL_COMMUNITY): Payer: Self-pay

## 2021-09-13 ENCOUNTER — Other Ambulatory Visit (HOSPITAL_COMMUNITY): Payer: Self-pay

## 2021-10-31 DIAGNOSIS — R1031 Right lower quadrant pain: Secondary | ICD-10-CM | POA: Diagnosis not present

## 2021-10-31 DIAGNOSIS — A048 Other specified bacterial intestinal infections: Secondary | ICD-10-CM | POA: Diagnosis not present

## 2021-10-31 DIAGNOSIS — R0600 Dyspnea, unspecified: Secondary | ICD-10-CM | POA: Diagnosis not present

## 2021-11-07 DIAGNOSIS — R1031 Right lower quadrant pain: Secondary | ICD-10-CM | POA: Diagnosis not present

## 2021-11-07 DIAGNOSIS — R0600 Dyspnea, unspecified: Secondary | ICD-10-CM | POA: Diagnosis not present

## 2021-11-11 DIAGNOSIS — R102 Pelvic and perineal pain: Secondary | ICD-10-CM | POA: Diagnosis not present

## 2021-11-13 DIAGNOSIS — A048 Other specified bacterial intestinal infections: Secondary | ICD-10-CM | POA: Diagnosis not present

## 2021-11-22 ENCOUNTER — Other Ambulatory Visit (HOSPITAL_COMMUNITY): Payer: Self-pay

## 2021-12-13 DIAGNOSIS — Z23 Encounter for immunization: Secondary | ICD-10-CM | POA: Diagnosis not present

## 2021-12-13 DIAGNOSIS — R1031 Right lower quadrant pain: Secondary | ICD-10-CM | POA: Diagnosis not present

## 2022-02-03 DIAGNOSIS — W010XXA Fall on same level from slipping, tripping and stumbling without subsequent striking against object, initial encounter: Secondary | ICD-10-CM | POA: Diagnosis not present

## 2022-02-03 DIAGNOSIS — M1712 Unilateral primary osteoarthritis, left knee: Secondary | ICD-10-CM | POA: Diagnosis not present

## 2022-02-03 DIAGNOSIS — M25531 Pain in right wrist: Secondary | ICD-10-CM | POA: Diagnosis not present

## 2022-02-03 DIAGNOSIS — M25551 Pain in right hip: Secondary | ICD-10-CM | POA: Diagnosis not present

## 2022-02-03 DIAGNOSIS — M25562 Pain in left knee: Secondary | ICD-10-CM | POA: Diagnosis not present

## 2022-02-03 DIAGNOSIS — R0781 Pleurodynia: Secondary | ICD-10-CM | POA: Diagnosis not present

## 2022-02-17 ENCOUNTER — Other Ambulatory Visit (HOSPITAL_COMMUNITY): Payer: Self-pay

## 2022-03-03 ENCOUNTER — Other Ambulatory Visit (HOSPITAL_COMMUNITY): Payer: Self-pay | Admitting: Family Medicine

## 2022-03-03 ENCOUNTER — Ambulatory Visit (HOSPITAL_COMMUNITY)
Admission: RE | Admit: 2022-03-03 | Discharge: 2022-03-03 | Disposition: A | Discharge status: Home / Self Care | Payer: PRIVATE HEALTH INSURANCE | Source: Ambulatory Visit | Attending: Family Medicine | Admitting: Family Medicine

## 2022-03-03 DIAGNOSIS — T1490XA Injury, unspecified, initial encounter: Secondary | ICD-10-CM

## 2022-03-03 DIAGNOSIS — M7989 Other specified soft tissue disorders: Secondary | ICD-10-CM | POA: Diagnosis not present

## 2022-03-03 DIAGNOSIS — S99911A Unspecified injury of right ankle, initial encounter: Secondary | ICD-10-CM | POA: Diagnosis not present

## 2022-03-19 ENCOUNTER — Other Ambulatory Visit (HOSPITAL_COMMUNITY): Payer: Self-pay | Admitting: Orthopedic Surgery

## 2022-03-19 ENCOUNTER — Other Ambulatory Visit: Payer: Self-pay | Admitting: Orthopedic Surgery

## 2022-03-19 DIAGNOSIS — S9781XS Crushing injury of right foot, sequela: Secondary | ICD-10-CM

## 2022-03-21 DIAGNOSIS — M79651 Pain in right thigh: Secondary | ICD-10-CM | POA: Diagnosis not present

## 2022-03-21 DIAGNOSIS — R0609 Other forms of dyspnea: Secondary | ICD-10-CM | POA: Diagnosis not present

## 2022-03-21 DIAGNOSIS — R051 Acute cough: Secondary | ICD-10-CM | POA: Diagnosis not present

## 2022-03-21 DIAGNOSIS — U071 COVID-19: Secondary | ICD-10-CM | POA: Diagnosis not present

## 2022-03-26 ENCOUNTER — Ambulatory Visit (HOSPITAL_COMMUNITY)
Admission: RE | Admit: 2022-03-26 | Discharge: 2022-03-26 | Disposition: A | Discharge status: Home / Self Care | Payer: PRIVATE HEALTH INSURANCE | Source: Ambulatory Visit | Attending: Orthopedic Surgery | Admitting: Orthopedic Surgery

## 2022-03-26 DIAGNOSIS — S9781XS Crushing injury of right foot, sequela: Secondary | ICD-10-CM

## 2022-03-31 DIAGNOSIS — S8781XD Crushing injury of right lower leg, subsequent encounter: Secondary | ICD-10-CM | POA: Diagnosis not present

## 2022-03-31 DIAGNOSIS — W010XXD Fall on same level from slipping, tripping and stumbling without subsequent striking against object, subsequent encounter: Secondary | ICD-10-CM | POA: Diagnosis not present

## 2022-03-31 DIAGNOSIS — U071 COVID-19: Secondary | ICD-10-CM | POA: Diagnosis not present

## 2022-03-31 DIAGNOSIS — M25562 Pain in left knee: Secondary | ICD-10-CM | POA: Diagnosis not present

## 2022-03-31 DIAGNOSIS — J22 Unspecified acute lower respiratory infection: Secondary | ICD-10-CM | POA: Diagnosis not present

## 2022-04-22 ENCOUNTER — Other Ambulatory Visit (HOSPITAL_COMMUNITY): Payer: Self-pay

## 2022-04-23 ENCOUNTER — Other Ambulatory Visit (HOSPITAL_COMMUNITY): Payer: Self-pay

## 2022-04-23 MED ORDER — EZETIMIBE 10 MG PO TABS
10.0000 mg | ORAL_TABLET | Freq: Every morning | ORAL | 1 refills | Status: DC
  Filled 2022-04-23 – 2022-05-16 (×4): qty 90, 90d supply, fill #0
  Filled 2022-09-01: qty 90, 90d supply, fill #1

## 2022-04-23 MED ORDER — PROPRANOLOL HCL ER 60 MG PO CP24
60.0000 mg | ORAL_CAPSULE | Freq: Every day | ORAL | 1 refills | Status: DC
  Filled 2022-04-23 – 2022-05-16 (×3): qty 90, 90d supply, fill #0
  Filled 2022-09-01: qty 90, 90d supply, fill #1

## 2022-04-23 MED ORDER — FENOFIBRATE 145 MG PO TABS
145.0000 mg | ORAL_TABLET | Freq: Every day | ORAL | 1 refills | Status: DC
  Filled 2022-04-23 – 2022-05-16 (×3): qty 90, 90d supply, fill #0
  Filled 2022-09-01: qty 90, 90d supply, fill #1

## 2022-04-25 ENCOUNTER — Other Ambulatory Visit (HOSPITAL_COMMUNITY): Payer: Self-pay

## 2022-05-05 ENCOUNTER — Other Ambulatory Visit (HOSPITAL_COMMUNITY): Payer: Self-pay

## 2022-05-06 ENCOUNTER — Other Ambulatory Visit (HOSPITAL_COMMUNITY): Payer: Self-pay

## 2022-05-06 DIAGNOSIS — M25562 Pain in left knee: Secondary | ICD-10-CM | POA: Diagnosis not present

## 2022-05-06 DIAGNOSIS — G8929 Other chronic pain: Secondary | ICD-10-CM | POA: Diagnosis not present

## 2022-05-06 DIAGNOSIS — Z1331 Encounter for screening for depression: Secondary | ICD-10-CM | POA: Diagnosis not present

## 2022-05-06 DIAGNOSIS — Z0000 Encounter for general adult medical examination without abnormal findings: Secondary | ICD-10-CM | POA: Diagnosis not present

## 2022-05-06 DIAGNOSIS — R3911 Hesitancy of micturition: Secondary | ICD-10-CM | POA: Diagnosis not present

## 2022-05-06 DIAGNOSIS — R739 Hyperglycemia, unspecified: Secondary | ICD-10-CM | POA: Diagnosis not present

## 2022-05-06 DIAGNOSIS — I10 Essential (primary) hypertension: Secondary | ICD-10-CM | POA: Diagnosis not present

## 2022-05-06 DIAGNOSIS — E079 Disorder of thyroid, unspecified: Secondary | ICD-10-CM | POA: Diagnosis not present

## 2022-05-06 DIAGNOSIS — E782 Mixed hyperlipidemia: Secondary | ICD-10-CM | POA: Diagnosis not present

## 2022-05-06 DIAGNOSIS — Z1330 Encounter for screening examination for mental health and behavioral disorders, unspecified: Secondary | ICD-10-CM | POA: Diagnosis not present

## 2022-05-16 ENCOUNTER — Other Ambulatory Visit (HOSPITAL_COMMUNITY): Payer: Self-pay

## 2022-05-16 DIAGNOSIS — D649 Anemia, unspecified: Secondary | ICD-10-CM | POA: Diagnosis not present

## 2022-05-16 DIAGNOSIS — E119 Type 2 diabetes mellitus without complications: Secondary | ICD-10-CM | POA: Diagnosis not present

## 2022-05-16 DIAGNOSIS — I10 Essential (primary) hypertension: Secondary | ICD-10-CM | POA: Diagnosis not present

## 2022-05-16 DIAGNOSIS — Z23 Encounter for immunization: Secondary | ICD-10-CM | POA: Diagnosis not present

## 2022-05-16 DIAGNOSIS — E782 Mixed hyperlipidemia: Secondary | ICD-10-CM | POA: Diagnosis not present

## 2022-05-16 MED ORDER — FREESTYLE LIBRE 2 SENSOR MISC
3 refills | Status: DC
  Filled 2022-05-16: qty 2, 28d supply, fill #0
  Filled 2022-06-09: qty 4, 56d supply, fill #1
  Filled 2022-06-09: qty 2, 28d supply, fill #1
  Filled 2022-08-08: qty 1, 14d supply, fill #2
  Filled 2022-09-01: qty 6, 84d supply, fill #3
  Filled 2022-09-03: qty 4, 56d supply, fill #3

## 2022-05-16 MED ORDER — FREESTYLE LIBRE 2 READER DEVI
1 refills | Status: DC
  Filled 2022-05-16: qty 1, 1d supply, fill #0
  Filled 2022-06-09: qty 2, 28d supply, fill #0

## 2022-05-16 MED ORDER — METFORMIN HCL ER 500 MG PO TB24
500.0000 mg | ORAL_TABLET | Freq: Every day | ORAL | 11 refills | Status: DC
  Filled 2022-05-16: qty 30, 30d supply, fill #0
  Filled 2022-06-08: qty 30, 30d supply, fill #1
  Filled 2022-06-09: qty 90, 90d supply, fill #1
  Filled 2022-09-01: qty 90, 90d supply, fill #2
  Filled 2022-11-28: qty 90, 90d supply, fill #3

## 2022-05-27 DIAGNOSIS — D649 Anemia, unspecified: Secondary | ICD-10-CM | POA: Diagnosis not present

## 2022-06-09 ENCOUNTER — Other Ambulatory Visit (HOSPITAL_COMMUNITY): Payer: Self-pay

## 2022-06-25 DIAGNOSIS — G8929 Other chronic pain: Secondary | ICD-10-CM | POA: Diagnosis not present

## 2022-06-25 DIAGNOSIS — M1712 Unilateral primary osteoarthritis, left knee: Secondary | ICD-10-CM | POA: Diagnosis not present

## 2022-06-25 DIAGNOSIS — M23301 Other meniscus derangements, unspecified lateral meniscus, left knee: Secondary | ICD-10-CM | POA: Diagnosis not present

## 2022-06-25 DIAGNOSIS — E119 Type 2 diabetes mellitus without complications: Secondary | ICD-10-CM | POA: Diagnosis not present

## 2022-06-25 DIAGNOSIS — M25562 Pain in left knee: Secondary | ICD-10-CM | POA: Diagnosis not present

## 2022-06-27 ENCOUNTER — Other Ambulatory Visit (HOSPITAL_COMMUNITY): Payer: Self-pay

## 2022-06-27 DIAGNOSIS — E119 Type 2 diabetes mellitus without complications: Secondary | ICD-10-CM | POA: Diagnosis not present

## 2022-06-27 MED ORDER — JARDIANCE 10 MG PO TABS
10.0000 mg | ORAL_TABLET | Freq: Every day | ORAL | 11 refills | Status: AC
  Filled 2022-06-27: qty 30, 30d supply, fill #0
  Filled 2022-07-22: qty 30, 30d supply, fill #1
  Filled 2022-08-21: qty 90, 90d supply, fill #2

## 2022-07-22 ENCOUNTER — Other Ambulatory Visit (HOSPITAL_COMMUNITY): Payer: Self-pay

## 2022-07-29 DIAGNOSIS — R1013 Epigastric pain: Secondary | ICD-10-CM | POA: Diagnosis not present

## 2022-07-29 DIAGNOSIS — I10 Essential (primary) hypertension: Secondary | ICD-10-CM | POA: Diagnosis not present

## 2022-07-29 DIAGNOSIS — E119 Type 2 diabetes mellitus without complications: Secondary | ICD-10-CM | POA: Diagnosis not present

## 2022-07-30 DIAGNOSIS — Z713 Dietary counseling and surveillance: Secondary | ICD-10-CM | POA: Diagnosis not present

## 2022-07-30 DIAGNOSIS — R1013 Epigastric pain: Secondary | ICD-10-CM | POA: Diagnosis not present

## 2022-07-30 DIAGNOSIS — E119 Type 2 diabetes mellitus without complications: Secondary | ICD-10-CM | POA: Diagnosis not present

## 2022-08-08 ENCOUNTER — Other Ambulatory Visit (HOSPITAL_COMMUNITY): Payer: Self-pay

## 2022-08-21 ENCOUNTER — Other Ambulatory Visit (HOSPITAL_COMMUNITY): Payer: Self-pay

## 2022-09-02 ENCOUNTER — Other Ambulatory Visit (HOSPITAL_COMMUNITY): Payer: Self-pay

## 2022-09-03 ENCOUNTER — Other Ambulatory Visit (HOSPITAL_COMMUNITY): Payer: Self-pay

## 2022-10-07 ENCOUNTER — Other Ambulatory Visit (HOSPITAL_COMMUNITY): Payer: Self-pay

## 2022-10-07 DIAGNOSIS — E119 Type 2 diabetes mellitus without complications: Secondary | ICD-10-CM | POA: Diagnosis not present

## 2022-10-07 DIAGNOSIS — Z1231 Encounter for screening mammogram for malignant neoplasm of breast: Secondary | ICD-10-CM | POA: Diagnosis not present

## 2022-10-07 DIAGNOSIS — I10 Essential (primary) hypertension: Secondary | ICD-10-CM | POA: Diagnosis not present

## 2022-10-07 DIAGNOSIS — E782 Mixed hyperlipidemia: Secondary | ICD-10-CM | POA: Diagnosis not present

## 2022-10-07 MED ORDER — AMMONIUM LACTATE 12 % EX LOTN
TOPICAL_LOTION | Freq: Two times a day (BID) | CUTANEOUS | 0 refills | Status: AC
Start: 2022-10-07 — End: ?
  Filled 2022-10-07: qty 400, 30d supply, fill #0

## 2022-10-08 DIAGNOSIS — E119 Type 2 diabetes mellitus without complications: Secondary | ICD-10-CM | POA: Diagnosis not present

## 2022-10-08 DIAGNOSIS — Z713 Dietary counseling and surveillance: Secondary | ICD-10-CM | POA: Diagnosis not present

## 2022-10-20 ENCOUNTER — Other Ambulatory Visit (HOSPITAL_COMMUNITY): Payer: Self-pay

## 2022-10-20 DIAGNOSIS — L853 Xerosis cutis: Secondary | ICD-10-CM | POA: Diagnosis not present

## 2022-10-20 DIAGNOSIS — L821 Other seborrheic keratosis: Secondary | ICD-10-CM | POA: Diagnosis not present

## 2022-10-20 DIAGNOSIS — E782 Mixed hyperlipidemia: Secondary | ICD-10-CM | POA: Diagnosis not present

## 2022-10-20 DIAGNOSIS — D1801 Hemangioma of skin and subcutaneous tissue: Secondary | ICD-10-CM | POA: Diagnosis not present

## 2022-10-20 DIAGNOSIS — E119 Type 2 diabetes mellitus without complications: Secondary | ICD-10-CM | POA: Diagnosis not present

## 2022-10-20 MED ORDER — PRECISION QID TEST VI STRP
ORAL_STRIP | 12 refills | Status: AC
  Filled 2022-10-20: qty 100, 33d supply, fill #0

## 2022-10-20 MED ORDER — FREESTYLE LITE W/DEVICE KIT
PACK | 1 refills | Status: AC
Start: 2022-10-20 — End: ?
  Filled 2022-10-20: qty 1, 1d supply, fill #0

## 2022-10-20 MED ORDER — FREESTYLE LANCETS MISC
12 refills | Status: AC
  Filled 2022-10-20: qty 100, 33d supply, fill #0

## 2022-11-03 ENCOUNTER — Other Ambulatory Visit (HOSPITAL_COMMUNITY): Payer: Self-pay

## 2022-11-03 DIAGNOSIS — Z1231 Encounter for screening mammogram for malignant neoplasm of breast: Secondary | ICD-10-CM | POA: Diagnosis not present

## 2022-11-14 ENCOUNTER — Other Ambulatory Visit (HOSPITAL_COMMUNITY): Payer: Self-pay

## 2022-11-14 DIAGNOSIS — F439 Reaction to severe stress, unspecified: Secondary | ICD-10-CM | POA: Diagnosis not present

## 2022-11-14 MED ORDER — BUSPIRONE HCL 5 MG PO TABS
5.0000 mg | ORAL_TABLET | Freq: Two times a day (BID) | ORAL | 2 refills | Status: DC
  Filled 2022-11-14: qty 60, 30d supply, fill #0

## 2022-11-28 ENCOUNTER — Other Ambulatory Visit (HOSPITAL_COMMUNITY): Payer: Self-pay

## 2022-12-02 ENCOUNTER — Other Ambulatory Visit (HOSPITAL_COMMUNITY): Payer: Self-pay

## 2022-12-02 MED ORDER — PROPRANOLOL HCL ER 60 MG PO CP24
60.0000 mg | ORAL_CAPSULE | Freq: Every day | ORAL | 3 refills | Status: DC
  Filled 2022-12-02: qty 90, 90d supply, fill #0
  Filled 2023-05-18 (×2): qty 90, 90d supply, fill #1
  Filled 2023-08-14: qty 90, 90d supply, fill #2
  Filled 2023-11-08: qty 90, 90d supply, fill #3

## 2022-12-02 MED ORDER — EZETIMIBE 10 MG PO TABS
10.0000 mg | ORAL_TABLET | Freq: Every morning | ORAL | 3 refills | Status: DC
  Filled 2022-12-02: qty 90, 90d supply, fill #0
  Filled 2023-05-18: qty 90, 90d supply, fill #1
  Filled 2023-08-14: qty 90, 90d supply, fill #2
  Filled 2023-11-08: qty 90, 90d supply, fill #3

## 2022-12-02 MED ORDER — FENOFIBRATE 145 MG PO TABS
145.0000 mg | ORAL_TABLET | Freq: Every day | ORAL | 3 refills | Status: DC
  Filled 2022-12-02: qty 90, 90d supply, fill #0
  Filled 2023-05-18: qty 90, 90d supply, fill #1
  Filled 2023-08-14: qty 90, 90d supply, fill #2
  Filled 2023-11-08: qty 90, 90d supply, fill #3

## 2023-01-12 ENCOUNTER — Other Ambulatory Visit (HOSPITAL_COMMUNITY): Payer: Self-pay

## 2023-01-12 DIAGNOSIS — E782 Mixed hyperlipidemia: Secondary | ICD-10-CM | POA: Diagnosis not present

## 2023-01-12 DIAGNOSIS — E119 Type 2 diabetes mellitus without complications: Secondary | ICD-10-CM | POA: Diagnosis not present

## 2023-01-12 DIAGNOSIS — J019 Acute sinusitis, unspecified: Secondary | ICD-10-CM | POA: Diagnosis not present

## 2023-01-12 DIAGNOSIS — B9689 Other specified bacterial agents as the cause of diseases classified elsewhere: Secondary | ICD-10-CM | POA: Diagnosis not present

## 2023-01-12 MED ORDER — DOXYCYCLINE HYCLATE 100 MG PO CAPS
100.0000 mg | ORAL_CAPSULE | Freq: Every day | ORAL | 0 refills | Status: AC
  Filled 2023-01-12: qty 20, 10d supply, fill #0

## 2023-02-02 ENCOUNTER — Ambulatory Visit: Payer: Self-pay

## 2023-02-02 ENCOUNTER — Other Ambulatory Visit: Payer: Self-pay | Admitting: Nurse Practitioner

## 2023-02-02 ENCOUNTER — Other Ambulatory Visit (HOSPITAL_COMMUNITY): Payer: Self-pay

## 2023-02-02 DIAGNOSIS — M546 Pain in thoracic spine: Secondary | ICD-10-CM

## 2023-02-02 MED ORDER — CYCLOBENZAPRINE HCL 5 MG PO TABS
5.0000 mg | ORAL_TABLET | Freq: Every evening | ORAL | 0 refills | Status: AC
  Filled 2023-02-02: qty 15, 15d supply, fill #0

## 2023-02-18 DIAGNOSIS — E119 Type 2 diabetes mellitus without complications: Secondary | ICD-10-CM | POA: Diagnosis not present

## 2023-02-18 DIAGNOSIS — E782 Mixed hyperlipidemia: Secondary | ICD-10-CM | POA: Diagnosis not present

## 2023-04-13 DIAGNOSIS — Z780 Asymptomatic menopausal state: Secondary | ICD-10-CM | POA: Diagnosis not present

## 2023-04-13 DIAGNOSIS — E119 Type 2 diabetes mellitus without complications: Secondary | ICD-10-CM | POA: Diagnosis not present

## 2023-04-13 DIAGNOSIS — E782 Mixed hyperlipidemia: Secondary | ICD-10-CM | POA: Diagnosis not present

## 2023-05-07 DIAGNOSIS — G5621 Lesion of ulnar nerve, right upper limb: Secondary | ICD-10-CM | POA: Diagnosis not present

## 2023-05-07 DIAGNOSIS — G5601 Carpal tunnel syndrome, right upper limb: Secondary | ICD-10-CM | POA: Diagnosis not present

## 2023-05-07 DIAGNOSIS — M65831 Other synovitis and tenosynovitis, right forearm: Secondary | ICD-10-CM | POA: Diagnosis not present

## 2023-05-18 ENCOUNTER — Other Ambulatory Visit (HOSPITAL_COMMUNITY): Payer: Self-pay

## 2023-05-18 MED ORDER — METFORMIN HCL ER 500 MG PO TB24
500.0000 mg | ORAL_TABLET | Freq: Every day | ORAL | 11 refills | Status: DC
  Filled 2023-05-18: qty 90, 90d supply, fill #0
  Filled 2023-05-18: qty 30, 30d supply, fill #0
  Filled 2023-10-05 (×2): qty 90, 90d supply, fill #1
  Filled 2024-02-01: qty 90, 90d supply, fill #2
  Filled 2024-04-27: qty 90, 90d supply, fill #3

## 2023-05-25 ENCOUNTER — Other Ambulatory Visit (HOSPITAL_COMMUNITY): Payer: Self-pay

## 2023-06-03 DIAGNOSIS — M810 Age-related osteoporosis without current pathological fracture: Secondary | ICD-10-CM | POA: Diagnosis not present

## 2023-06-03 DIAGNOSIS — Z780 Asymptomatic menopausal state: Secondary | ICD-10-CM | POA: Diagnosis not present
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
## 2023-07-13 ENCOUNTER — Other Ambulatory Visit (HOSPITAL_COMMUNITY): Payer: Self-pay

## 2023-07-13 DIAGNOSIS — Z1330 Encounter for screening examination for mental health and behavioral disorders, unspecified: Secondary | ICD-10-CM | POA: Diagnosis not present

## 2023-07-13 DIAGNOSIS — M810 Age-related osteoporosis without current pathological fracture: Secondary | ICD-10-CM | POA: Diagnosis not present

## 2023-07-13 DIAGNOSIS — K635 Polyp of colon: Secondary | ICD-10-CM | POA: Diagnosis not present

## 2023-07-13 DIAGNOSIS — Z0000 Encounter for general adult medical examination without abnormal findings: Secondary | ICD-10-CM | POA: Diagnosis not present

## 2023-07-13 DIAGNOSIS — E119 Type 2 diabetes mellitus without complications: Secondary | ICD-10-CM | POA: Diagnosis not present

## 2023-07-13 DIAGNOSIS — Z1331 Encounter for screening for depression: Secondary | ICD-10-CM | POA: Diagnosis not present

## 2023-07-13 DIAGNOSIS — E782 Mixed hyperlipidemia: Secondary | ICD-10-CM | POA: Diagnosis not present

## 2023-07-13 MED ORDER — METFORMIN HCL ER 750 MG PO TB24
750.0000 mg | ORAL_TABLET | Freq: Every day | ORAL | 11 refills | Status: DC
  Filled 2023-07-13: qty 30, 30d supply, fill #0
  Filled 2023-08-14: qty 90, 90d supply, fill #1
  Filled 2023-11-08: qty 90, 90d supply, fill #2
  Filled 2024-04-27: qty 90, 90d supply, fill #3

## 2023-07-13 MED ORDER — CALCIUM CARB-CHOLECALCIFEROL 600-10 MG-MCG PO TABS: 1.0000 | ORAL_TABLET | Freq: Two times a day (BID) | ORAL | 11 refills | Status: AC

## 2023-07-31 DIAGNOSIS — D649 Anemia, unspecified: Secondary | ICD-10-CM | POA: Diagnosis not present

## 2023-08-14 ENCOUNTER — Other Ambulatory Visit (HOSPITAL_COMMUNITY): Payer: Self-pay

## 2023-08-31 ENCOUNTER — Other Ambulatory Visit (HOSPITAL_COMMUNITY): Payer: Self-pay

## 2023-08-31 MED ORDER — PREDNISOLONE ACETATE 1 % OP SUSP
1.0000 [drp] | Freq: Four times a day (QID) | OPHTHALMIC | 0 refills | Status: AC
  Filled 2023-08-31: qty 5, 25d supply, fill #0

## 2023-09-01 ENCOUNTER — Emergency Department (HOSPITAL_COMMUNITY)
Admission: EM | Admit: 2023-09-01 | Discharge: 2023-09-01 | Disposition: A | Discharge status: Home / Self Care | Payer: Worker's Compensation | Attending: Emergency Medicine | Admitting: Emergency Medicine

## 2023-09-01 ENCOUNTER — Other Ambulatory Visit: Payer: Self-pay

## 2023-09-01 ENCOUNTER — Emergency Department (HOSPITAL_COMMUNITY): Payer: Worker's Compensation

## 2023-09-01 ENCOUNTER — Encounter (HOSPITAL_COMMUNITY): Payer: Self-pay

## 2023-09-01 DIAGNOSIS — Y990 Civilian activity done for income or pay: Secondary | ICD-10-CM | POA: Diagnosis not present

## 2023-09-01 DIAGNOSIS — Z7984 Long term (current) use of oral hypoglycemic drugs: Secondary | ICD-10-CM | POA: Insufficient documentation

## 2023-09-01 DIAGNOSIS — Z79899 Other long term (current) drug therapy: Secondary | ICD-10-CM | POA: Diagnosis not present

## 2023-09-01 DIAGNOSIS — I10 Essential (primary) hypertension: Secondary | ICD-10-CM | POA: Diagnosis not present

## 2023-09-01 DIAGNOSIS — S0990XA Unspecified injury of head, initial encounter: Secondary | ICD-10-CM | POA: Insufficient documentation

## 2023-09-01 DIAGNOSIS — M4802 Spinal stenosis, cervical region: Secondary | ICD-10-CM | POA: Insufficient documentation

## 2023-09-01 LAB — CBC
HCT: 32.9 % — ABNORMAL LOW (ref 36.0–46.0)
Hemoglobin: 10.5 g/dL — ABNORMAL LOW (ref 12.0–15.0)
MCH: 30.1 pg (ref 26.0–34.0)
MCHC: 31.9 g/dL (ref 30.0–36.0)
MCV: 94.3 fL (ref 80.0–100.0)
Platelets: 370 10*3/uL (ref 150–400)
RBC: 3.49 MIL/uL — ABNORMAL LOW (ref 3.87–5.11)
RDW: 12.4 % (ref 11.5–15.5)
WBC: 4.9 10*3/uL (ref 4.0–10.5)
nRBC: 0 % (ref 0.0–0.2)

## 2023-09-01 LAB — URINALYSIS, ROUTINE W REFLEX MICROSCOPIC
Bilirubin Urine: NEGATIVE
Glucose, UA: NEGATIVE mg/dL
Hgb urine dipstick: NEGATIVE
Ketones, ur: NEGATIVE mg/dL
Leukocytes,Ua: NEGATIVE
Nitrite: NEGATIVE
Protein, ur: NEGATIVE mg/dL
Specific Gravity, Urine: 1.01 (ref 1.005–1.030)
pH: 8 (ref 5.0–8.0)

## 2023-09-01 LAB — COMPREHENSIVE METABOLIC PANEL
ALT: 21 U/L (ref 0–44)
AST: 27 U/L (ref 15–41)
Albumin: 3.8 g/dL (ref 3.5–5.0)
Alkaline Phosphatase: 35 U/L — ABNORMAL LOW (ref 38–126)
Anion gap: 12 (ref 5–15)
BUN: 14 mg/dL (ref 8–23)
CO2: 25 mmol/L (ref 22–32)
Calcium: 9.3 mg/dL (ref 8.9–10.3)
Chloride: 103 mmol/L (ref 98–111)
Creatinine, Ser: 1.17 mg/dL — ABNORMAL HIGH (ref 0.44–1.00)
GFR, Estimated: 52 mL/min — ABNORMAL LOW (ref >60)
Glucose, Bld: 107 mg/dL — ABNORMAL HIGH (ref 70–99)
Potassium: 4.1 mmol/L (ref 3.5–5.1)
Sodium: 140 mmol/L (ref 135–145)
Total Bilirubin: 0.7 mg/dL (ref 0.3–1.2)
Total Protein: 6.6 g/dL (ref 6.5–8.1)

## 2023-09-01 MED ORDER — IBUPROFEN 800 MG PO TABS
800.0000 mg | ORAL_TABLET | Freq: Once | ORAL | Status: AC
  Administered 2023-09-01: 800 mg via ORAL
  Filled 2023-09-01: qty 1
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
## 2023-09-01 NOTE — Discharge Instructions (Addendum)
A concussion is a very mild traumatic brain injury caused by a bump, jolt or blow to the head, most people recover quickly and fully. You can experience a wide variety of symptoms including:   - Confusion      - Difficulty concentrating       - Trouble remembering new info  - Headache      - Dizziness        - Fuzzy or blurry vision  - Fatigue      - Balance problems      - Light sensitivity  - Mood swings     - Changes in sleep or difficulty sleeping   To help these symptoms improve make sure you are getting plenty of rest, avoid screen time, loud music and strenuous mental activities. Avoid any strenuous physical activities, once your symptoms have resolved a slow and gradual return to activity is recommended. It is very important that you avoid situations in which you might sustain a second head injury as this can be very dangerous and life threatening. You cannot be medically cleared to return to normal activities until you have followed up with your primary doctor or a concussion specialist for reevaluation.  

## 2023-09-07 ENCOUNTER — Other Ambulatory Visit (HOSPITAL_COMMUNITY): Payer: Self-pay

## 2023-09-07 MED ORDER — FLUCONAZOLE 150 MG PO TABS
150.0000 mg | ORAL_TABLET | Freq: Every day | ORAL | 0 refills | Status: AC
  Filled 2023-09-07: qty 2, 2d supply, fill #0

## 2023-09-14 ENCOUNTER — Other Ambulatory Visit (HOSPITAL_COMMUNITY): Payer: Self-pay

## 2023-09-14 MED ORDER — MELOXICAM 15 MG PO TABS
15.0000 mg | ORAL_TABLET | Freq: Every day | ORAL | 2 refills | Status: AC
  Filled 2023-09-14: qty 30, 30d supply, fill #0
  Filled 2023-10-12: qty 30, 30d supply, fill #1

## 2023-09-14 MED ORDER — GABAPENTIN 100 MG PO CAPS
100.0000 mg | ORAL_CAPSULE | Freq: Every day | ORAL | 0 refills | Status: DC
  Filled 2023-09-14: qty 30, 15d supply, fill #0

## 2023-10-05 ENCOUNTER — Other Ambulatory Visit (HOSPITAL_COMMUNITY): Payer: Self-pay

## 2023-10-05 MED ORDER — GABAPENTIN 100 MG PO CAPS
100.0000 mg | ORAL_CAPSULE | Freq: Every day | ORAL | 0 refills | Status: AC
  Filled 2023-10-05: qty 60, 30d supply, fill #0

## 2023-10-06 ENCOUNTER — Other Ambulatory Visit (HOSPITAL_BASED_OUTPATIENT_CLINIC_OR_DEPARTMENT_OTHER): Payer: Self-pay

## 2023-10-07 ENCOUNTER — Other Ambulatory Visit (HOSPITAL_COMMUNITY): Payer: Self-pay | Admitting: Family Medicine

## 2023-10-07 DIAGNOSIS — R42 Dizziness and giddiness: Secondary | ICD-10-CM

## 2023-10-07 DIAGNOSIS — R519 Headache, unspecified: Secondary | ICD-10-CM

## 2023-10-12 ENCOUNTER — Other Ambulatory Visit (HOSPITAL_COMMUNITY): Payer: Self-pay

## 2023-10-14 ENCOUNTER — Other Ambulatory Visit (HOSPITAL_COMMUNITY): Payer: Self-pay

## 2023-10-14 ENCOUNTER — Encounter (HOSPITAL_COMMUNITY): Payer: Self-pay

## 2023-10-14 ENCOUNTER — Ambulatory Visit (HOSPITAL_COMMUNITY)
Admission: RE | Admit: 2023-10-14 | Discharge: 2023-10-14 | Disposition: A | Discharge status: Home / Self Care | Payer: PRIVATE HEALTH INSURANCE | Source: Ambulatory Visit | Attending: Family Medicine | Admitting: Family Medicine

## 2023-10-14 DIAGNOSIS — R519 Headache, unspecified: Secondary | ICD-10-CM

## 2023-10-14 DIAGNOSIS — R42 Dizziness and giddiness: Secondary | ICD-10-CM

## 2023-10-14 MED ORDER — PREDNISONE 50 MG PO TABS
ORAL_TABLET | ORAL | 0 refills | Status: AC
  Filled 2023-10-14: qty 3, 1d supply, fill #0

## 2023-10-15 ENCOUNTER — Other Ambulatory Visit (HOSPITAL_COMMUNITY): Payer: Self-pay

## 2023-10-15 ENCOUNTER — Other Ambulatory Visit (HOSPITAL_BASED_OUTPATIENT_CLINIC_OR_DEPARTMENT_OTHER): Payer: Self-pay

## 2023-10-16 ENCOUNTER — Ambulatory Visit (HOSPITAL_COMMUNITY)
Admission: RE | Admit: 2023-10-16 | Discharge: 2023-10-16 | Disposition: A | Discharge status: Home / Self Care | Payer: Worker's Compensation | Source: Ambulatory Visit | Attending: Family Medicine | Admitting: Family Medicine

## 2023-10-16 DIAGNOSIS — R519 Headache, unspecified: Secondary | ICD-10-CM | POA: Insufficient documentation

## 2023-10-16 DIAGNOSIS — R42 Dizziness and giddiness: Secondary | ICD-10-CM | POA: Diagnosis present

## 2023-10-16 MED ORDER — GADOBUTROL 1 MMOL/ML IV SOLN
5.0000 mL | Freq: Once | INTRAVENOUS | Status: AC | PRN
  Administered 2023-10-16: 5 mL via INTRAVENOUS

## 2023-10-19 ENCOUNTER — Other Ambulatory Visit (HOSPITAL_COMMUNITY): Payer: Self-pay

## 2023-10-19 MED ORDER — ZOLPIDEM TARTRATE 5 MG PO TABS
5.0000 mg | ORAL_TABLET | Freq: Every evening | ORAL | 0 refills | Status: AC | PRN
Start: 2023-10-19 — End: ?
  Filled 2023-10-19: qty 14, 14d supply, fill #0

## 2023-11-09 ENCOUNTER — Other Ambulatory Visit (HOSPITAL_COMMUNITY): Payer: Self-pay

## 2023-11-12 ENCOUNTER — Other Ambulatory Visit (HOSPITAL_COMMUNITY): Payer: Self-pay

## 2023-11-16 ENCOUNTER — Other Ambulatory Visit (HOSPITAL_BASED_OUTPATIENT_CLINIC_OR_DEPARTMENT_OTHER): Payer: Self-pay

## 2023-11-18 ENCOUNTER — Other Ambulatory Visit (HOSPITAL_COMMUNITY): Payer: Self-pay

## 2023-11-18 MED ORDER — GABAPENTIN 300 MG PO CAPS
300.0000 mg | ORAL_CAPSULE | Freq: Every day | ORAL | 0 refills | Status: AC
Start: 2023-11-18 — End: ?
  Filled 2023-11-18: qty 90, 45d supply, fill #0

## 2023-11-18 MED ORDER — ZOLPIDEM TARTRATE 5 MG PO TABS
5.0000 mg | ORAL_TABLET | Freq: Every day | ORAL | 0 refills | Status: AC
  Filled 2023-11-18: qty 7, 7d supply, fill #0

## 2023-12-09 ENCOUNTER — Other Ambulatory Visit (HOSPITAL_COMMUNITY): Payer: Self-pay

## 2023-12-09 MED ORDER — PEG 3350-KCL-NA BICARB-NACL 420 G PO SOLR
ORAL | 0 refills | Status: AC
  Filled 2023-12-09: qty 4000, 1d supply, fill #0

## 2023-12-18 ENCOUNTER — Other Ambulatory Visit (HOSPITAL_COMMUNITY): Payer: Self-pay

## 2023-12-18 MED ORDER — ZOLPIDEM TARTRATE 5 MG PO TABS
5.0000 mg | ORAL_TABLET | Freq: Every evening | ORAL | 0 refills | Status: AC | PRN
Start: 2023-12-18 — End: ?
  Filled 2023-12-18: qty 7, 7d supply, fill #0

## 2023-12-18 MED ORDER — IBUPROFEN 800 MG PO TABS
800.0000 mg | ORAL_TABLET | Freq: Three times a day (TID) | ORAL | 1 refills | Status: AC
  Filled 2023-12-18: qty 60, 20d supply, fill #0

## 2023-12-24 ENCOUNTER — Other Ambulatory Visit: Payer: Self-pay

## 2024-01-18 ENCOUNTER — Other Ambulatory Visit (HOSPITAL_COMMUNITY): Payer: Self-pay

## 2024-01-18 DIAGNOSIS — E782 Mixed hyperlipidemia: Secondary | ICD-10-CM | POA: Diagnosis not present

## 2024-01-18 DIAGNOSIS — H539 Unspecified visual disturbance: Secondary | ICD-10-CM | POA: Diagnosis not present

## 2024-01-18 DIAGNOSIS — F0781 Postconcussional syndrome: Secondary | ICD-10-CM | POA: Diagnosis not present

## 2024-01-18 DIAGNOSIS — I10 Essential (primary) hypertension: Secondary | ICD-10-CM | POA: Diagnosis not present

## 2024-01-18 DIAGNOSIS — E119 Type 2 diabetes mellitus without complications: Secondary | ICD-10-CM | POA: Diagnosis not present

## 2024-01-18 DIAGNOSIS — H9311 Tinnitus, right ear: Secondary | ICD-10-CM | POA: Diagnosis not present

## 2024-01-18 DIAGNOSIS — R413 Other amnesia: Secondary | ICD-10-CM | POA: Diagnosis not present

## 2024-01-18 MED ORDER — FENOFIBRATE 145 MG PO TABS
145.0000 mg | ORAL_TABLET | Freq: Every day | ORAL | 3 refills | Status: DC
  Filled 2024-01-18 – 2024-01-25 (×3): qty 90, 90d supply, fill #0
  Filled 2024-04-27: qty 90, 90d supply, fill #1
  Filled 2024-08-23: qty 90, 90d supply, fill #2
  Filled 2024-11-04: qty 90, 90d supply, fill #3

## 2024-01-18 MED ORDER — PROPRANOLOL HCL ER 60 MG PO CP24
60.0000 mg | ORAL_CAPSULE | Freq: Every day | ORAL | 3 refills | Status: DC
  Filled 2024-01-18 – 2024-01-25 (×3): qty 90, 90d supply, fill #0
  Filled 2024-04-27: qty 90, 90d supply, fill #1
  Filled 2024-08-23: qty 90, 90d supply, fill #2
  Filled 2024-11-04: qty 90, 90d supply, fill #3

## 2024-01-18 MED ORDER — TRAZODONE HCL 50 MG PO TABS
50.0000 mg | ORAL_TABLET | Freq: Every day | ORAL | 11 refills | Status: DC
  Filled 2024-01-18: qty 30, 30d supply, fill #0

## 2024-01-18 MED ORDER — EZETIMIBE 10 MG PO TABS
10.0000 mg | ORAL_TABLET | Freq: Every day | ORAL | 3 refills | Status: DC
  Filled 2024-01-18 – 2024-01-25 (×3): qty 90, 90d supply, fill #0
  Filled 2024-04-27: qty 90, 90d supply, fill #1
  Filled 2024-08-23: qty 90, 90d supply, fill #2
  Filled 2024-11-04: qty 90, 90d supply, fill #3

## 2024-01-22 ENCOUNTER — Other Ambulatory Visit: Payer: Self-pay

## 2024-01-22 ENCOUNTER — Other Ambulatory Visit (HOSPITAL_COMMUNITY): Payer: Self-pay

## 2024-01-25 ENCOUNTER — Other Ambulatory Visit (HOSPITAL_COMMUNITY): Payer: Self-pay

## 2024-02-01 ENCOUNTER — Other Ambulatory Visit (HOSPITAL_COMMUNITY): Payer: Self-pay

## 2024-04-04 DIAGNOSIS — H93293 Other abnormal auditory perceptions, bilateral: Secondary | ICD-10-CM | POA: Diagnosis not present

## 2024-04-04 DIAGNOSIS — R42 Dizziness and giddiness: Secondary | ICD-10-CM | POA: Diagnosis not present

## 2024-04-04 DIAGNOSIS — H9201 Otalgia, right ear: Secondary | ICD-10-CM | POA: Diagnosis not present

## 2024-04-04 DIAGNOSIS — H9311 Tinnitus, right ear: Secondary | ICD-10-CM | POA: Diagnosis not present

## 2024-04-18 ENCOUNTER — Other Ambulatory Visit (HOSPITAL_COMMUNITY): Payer: Self-pay

## 2024-04-18 DIAGNOSIS — D649 Anemia, unspecified: Secondary | ICD-10-CM | POA: Diagnosis not present

## 2024-04-18 DIAGNOSIS — E119 Type 2 diabetes mellitus without complications: Secondary | ICD-10-CM | POA: Diagnosis not present

## 2024-04-18 DIAGNOSIS — E782 Mixed hyperlipidemia: Secondary | ICD-10-CM | POA: Diagnosis not present

## 2024-04-18 DIAGNOSIS — G43709 Chronic migraine without aura, not intractable, without status migrainosus: Secondary | ICD-10-CM | POA: Diagnosis not present

## 2024-04-18 MED ORDER — NURTEC 75 MG PO TBDP
75.0000 mg | ORAL_TABLET | ORAL | 1 refills | Status: DC
Start: 2024-04-18 — End: 2024-08-25
  Filled 2024-04-18 – 2024-04-27 (×2): qty 16, 32d supply, fill #0

## 2024-04-27 ENCOUNTER — Other Ambulatory Visit (HOSPITAL_COMMUNITY): Payer: Self-pay

## 2024-04-28 ENCOUNTER — Other Ambulatory Visit (HOSPITAL_COMMUNITY): Payer: Self-pay

## 2024-04-28 ENCOUNTER — Encounter (HOSPITAL_COMMUNITY): Payer: Self-pay

## 2024-05-13 DIAGNOSIS — H40033 Anatomical narrow angle, bilateral: Secondary | ICD-10-CM | POA: Diagnosis not present

## 2024-05-13 DIAGNOSIS — H25813 Combined forms of age-related cataract, bilateral: Secondary | ICD-10-CM | POA: Diagnosis not present

## 2024-05-13 DIAGNOSIS — E119 Type 2 diabetes mellitus without complications: Secondary | ICD-10-CM | POA: Diagnosis not present

## 2024-08-23 ENCOUNTER — Other Ambulatory Visit (HOSPITAL_COMMUNITY): Payer: Self-pay

## 2024-08-24 ENCOUNTER — Other Ambulatory Visit (HOSPITAL_COMMUNITY): Payer: Self-pay

## 2024-08-24 MED ORDER — METFORMIN HCL ER 750 MG PO TB24
750.0000 mg | ORAL_TABLET | Freq: Every day | ORAL | 8 refills | Status: AC
  Filled 2024-08-24: qty 30, 30d supply, fill #0
  Filled 2025-01-03: qty 30, 30d supply, fill #1

## 2024-08-25 ENCOUNTER — Other Ambulatory Visit (HOSPITAL_COMMUNITY): Payer: Self-pay

## 2024-08-25 DIAGNOSIS — Z1212 Encounter for screening for malignant neoplasm of rectum: Secondary | ICD-10-CM | POA: Diagnosis not present

## 2024-08-25 DIAGNOSIS — Z1231 Encounter for screening mammogram for malignant neoplasm of breast: Secondary | ICD-10-CM | POA: Diagnosis not present

## 2024-08-25 DIAGNOSIS — E782 Mixed hyperlipidemia: Secondary | ICD-10-CM | POA: Diagnosis not present

## 2024-08-25 DIAGNOSIS — Z1211 Encounter for screening for malignant neoplasm of colon: Secondary | ICD-10-CM | POA: Diagnosis not present

## 2024-08-25 DIAGNOSIS — I10 Essential (primary) hypertension: Secondary | ICD-10-CM | POA: Diagnosis not present

## 2024-08-25 DIAGNOSIS — N6311 Unspecified lump in the right breast, upper outer quadrant: Secondary | ICD-10-CM | POA: Diagnosis not present

## 2024-08-25 DIAGNOSIS — E119 Type 2 diabetes mellitus without complications: Secondary | ICD-10-CM | POA: Diagnosis not present

## 2024-08-25 MED ORDER — METFORMIN HCL ER 750 MG PO TB24: 750.0000 mg | ORAL_TABLET | Freq: Every day | ORAL | 3 refills | Status: AC

## 2024-09-12 DIAGNOSIS — E119 Type 2 diabetes mellitus without complications: Secondary | ICD-10-CM | POA: Diagnosis not present

## 2024-10-12 DIAGNOSIS — N6311 Unspecified lump in the right breast, upper outer quadrant: Secondary | ICD-10-CM | POA: Diagnosis not present

## 2024-10-12 DIAGNOSIS — N644 Mastodynia: Secondary | ICD-10-CM | POA: Diagnosis not present

## 2024-11-02 ENCOUNTER — Other Ambulatory Visit (HOSPITAL_COMMUNITY): Payer: Self-pay

## 2024-11-02 MED ORDER — MELOXICAM 7.5 MG PO TABS
7.5000 mg | ORAL_TABLET | Freq: Every day | ORAL | 0 refills | Status: AC
  Filled 2024-11-02: qty 30, 30d supply, fill #0

## 2024-11-17 DIAGNOSIS — R1030 Lower abdominal pain, unspecified: Secondary | ICD-10-CM | POA: Diagnosis not present

## 2024-11-17 DIAGNOSIS — E782 Mixed hyperlipidemia: Secondary | ICD-10-CM | POA: Diagnosis not present

## 2024-11-17 DIAGNOSIS — I10 Essential (primary) hypertension: Secondary | ICD-10-CM | POA: Diagnosis not present

## 2024-11-17 DIAGNOSIS — G43909 Migraine, unspecified, not intractable, without status migrainosus: Secondary | ICD-10-CM | POA: Diagnosis not present

## 2024-11-17 DIAGNOSIS — I4719 Other supraventricular tachycardia: Secondary | ICD-10-CM | POA: Diagnosis not present

## 2024-11-17 DIAGNOSIS — K648 Other hemorrhoids: Secondary | ICD-10-CM | POA: Diagnosis not present

## 2024-11-17 DIAGNOSIS — K635 Polyp of colon: Secondary | ICD-10-CM | POA: Diagnosis not present

## 2024-11-17 DIAGNOSIS — Z860102 Personal history of hyperplastic colon polyps: Secondary | ICD-10-CM | POA: Diagnosis not present

## 2024-11-17 DIAGNOSIS — K6389 Other specified diseases of intestine: Secondary | ICD-10-CM | POA: Diagnosis not present

## 2024-11-17 DIAGNOSIS — I491 Atrial premature depolarization: Secondary | ICD-10-CM | POA: Diagnosis not present

## 2024-11-17 DIAGNOSIS — D649 Anemia, unspecified: Secondary | ICD-10-CM | POA: Diagnosis not present

## 2024-11-17 DIAGNOSIS — I493 Ventricular premature depolarization: Secondary | ICD-10-CM | POA: Diagnosis not present

## 2024-12-27 ENCOUNTER — Other Ambulatory Visit: Payer: Self-pay | Admitting: Gastroenterology

## 2024-12-27 ENCOUNTER — Ambulatory Visit
Admission: RE | Admit: 2024-12-27 | Discharge: 2024-12-27 | Disposition: A | Discharge status: Home / Self Care | Source: Ambulatory Visit | Attending: Gastroenterology | Admitting: Gastroenterology

## 2024-12-27 ENCOUNTER — Ambulatory Visit
Admission: RE | Admit: 2024-12-27 | Discharge: 2024-12-27 | Disposition: A | Source: Ambulatory Visit | Attending: Gastroenterology

## 2024-12-27 DIAGNOSIS — Y990 Civilian activity done for income or pay: Secondary | ICD-10-CM | POA: Diagnosis not present

## 2024-12-27 DIAGNOSIS — Z043 Encounter for examination and observation following other accident: Secondary | ICD-10-CM | POA: Diagnosis present

## 2025-01-03 ENCOUNTER — Other Ambulatory Visit (HOSPITAL_COMMUNITY): Payer: Self-pay

## 2025-01-03 ENCOUNTER — Other Ambulatory Visit: Payer: Self-pay

## 2025-01-04 ENCOUNTER — Other Ambulatory Visit (HOSPITAL_COMMUNITY): Payer: Self-pay

## 2025-01-04 MED ORDER — PROPRANOLOL HCL ER 60 MG PO CP24: 60.0000 mg | ORAL_CAPSULE | Freq: Every day | ORAL | 3 refills | Status: AC

## 2025-01-04 MED ORDER — EZETIMIBE 10 MG PO TABS: 10.0000 mg | ORAL_TABLET | Freq: Every day | ORAL | 2 refills | Status: AC

## 2025-01-04 MED ORDER — FENOFIBRATE 145 MG PO TABS: 145.0000 mg | ORAL_TABLET | Freq: Every evening | ORAL | 2 refills | Status: AC
